# Patient Record
Sex: Female | Born: 1990 | Race: White | Hispanic: No | Marital: Single | Smoking: Former smoker
Health system: Southern US, Community
[De-identification: ages and names within clinical notes are randomized; demographics above are authoritative.]

## PROBLEM LIST (undated history)

## (undated) DIAGNOSIS — K259 Gastric ulcer, unspecified as acute or chronic, without hemorrhage or perforation: Secondary | ICD-10-CM

## (undated) DIAGNOSIS — A630 Anogenital (venereal) warts: Secondary | ICD-10-CM

---
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
## 2012-09-14 ENCOUNTER — Emergency Department (HOSPITAL_BASED_OUTPATIENT_CLINIC_OR_DEPARTMENT_OTHER)
Admission: EM | Admit: 2012-09-14 | Discharge: 2012-09-14 | Disposition: A | Discharge status: Home / Self Care | Payer: Self-pay | Attending: Emergency Medicine | Admitting: Emergency Medicine

## 2012-09-14 ENCOUNTER — Encounter (HOSPITAL_BASED_OUTPATIENT_CLINIC_OR_DEPARTMENT_OTHER): Payer: Self-pay | Admitting: *Deleted

## 2012-09-14 DIAGNOSIS — R05 Cough: Secondary | ICD-10-CM | POA: Insufficient documentation

## 2012-09-14 DIAGNOSIS — R630 Anorexia: Secondary | ICD-10-CM | POA: Insufficient documentation

## 2012-09-14 DIAGNOSIS — R42 Dizziness and giddiness: Secondary | ICD-10-CM | POA: Insufficient documentation

## 2012-09-14 DIAGNOSIS — J020 Streptococcal pharyngitis: Secondary | ICD-10-CM | POA: Insufficient documentation

## 2012-09-14 DIAGNOSIS — R509 Fever, unspecified: Secondary | ICD-10-CM | POA: Insufficient documentation

## 2012-09-14 DIAGNOSIS — R5381 Other malaise: Secondary | ICD-10-CM | POA: Insufficient documentation

## 2012-09-14 DIAGNOSIS — R51 Headache: Secondary | ICD-10-CM | POA: Insufficient documentation

## 2012-09-14 DIAGNOSIS — R55 Syncope and collapse: Secondary | ICD-10-CM | POA: Insufficient documentation

## 2012-09-14 DIAGNOSIS — R109 Unspecified abdominal pain: Secondary | ICD-10-CM | POA: Insufficient documentation

## 2012-09-14 DIAGNOSIS — L519 Erythema multiforme, unspecified: Secondary | ICD-10-CM | POA: Insufficient documentation

## 2012-09-14 DIAGNOSIS — F17200 Nicotine dependence, unspecified, uncomplicated: Secondary | ICD-10-CM | POA: Insufficient documentation

## 2012-09-14 DIAGNOSIS — R059 Cough, unspecified: Secondary | ICD-10-CM | POA: Insufficient documentation

## 2012-09-14 DIAGNOSIS — IMO0001 Reserved for inherently not codable concepts without codable children: Secondary | ICD-10-CM | POA: Insufficient documentation

## 2012-09-14 DIAGNOSIS — R000 Tachycardia, unspecified: Secondary | ICD-10-CM | POA: Insufficient documentation

## 2012-09-14 DIAGNOSIS — E860 Dehydration: Secondary | ICD-10-CM | POA: Insufficient documentation

## 2012-09-14 DIAGNOSIS — Z3202 Encounter for pregnancy test, result negative: Secondary | ICD-10-CM | POA: Insufficient documentation

## 2012-09-14 LAB — URINE MICROSCOPIC-ADD ON

## 2012-09-14 LAB — PREGNANCY, URINE: Preg Test, Ur: NEGATIVE

## 2012-09-14 LAB — URINALYSIS, ROUTINE W REFLEX MICROSCOPIC
Glucose, UA: NEGATIVE mg/dL
Hgb urine dipstick: NEGATIVE
Leukocytes, UA: NEGATIVE
pH: 6 (ref 5.0–8.0)

## 2012-09-14 LAB — RAPID STREP SCREEN (MED CTR MEBANE ONLY): Streptococcus, Group A Screen (Direct): POSITIVE — AB

## 2012-09-14 MED ORDER — SODIUM CHLORIDE 0.9 % IV SOLN
Freq: Once | INTRAVENOUS | Status: AC
  Administered 2012-09-14: 20:00:00 via INTRAVENOUS

## 2012-09-14 MED ORDER — MAGIC MOUTHWASH W/LIDOCAINE: 10.0000 mL | Freq: Four times a day (QID) | ORAL | Status: DC | PRN

## 2012-09-14 MED ORDER — SODIUM CHLORIDE 0.9 % IV SOLN: Freq: Once | INTRAVENOUS | Status: DC

## 2012-09-14 MED ORDER — SODIUM CHLORIDE 0.9 % IV BOLUS (SEPSIS)
1000.0000 mL | Freq: Once | INTRAVENOUS | Status: AC
  Administered 2012-09-14: 1000 mL via INTRAVENOUS

## 2012-09-14 MED ORDER — PENICILLIN G BENZATHINE 1200000 UNIT/2ML IM SUSP
1.2000 10*6.[IU] | Freq: Once | INTRAMUSCULAR | Status: AC
  Administered 2012-09-14: 1.2 10*6.[IU] via INTRAMUSCULAR
  Filled 2012-09-14: qty 2

## 2012-09-14 MED ORDER — HYDROCODONE-ACETAMINOPHEN 7.5-500 MG/15ML PO SOLN
10.0000 mL | Freq: Once | ORAL | Status: AC
  Administered 2012-09-14: 10 mL via ORAL
  Filled 2012-09-14: qty 15

## 2012-09-14 NOTE — ED Notes (Signed)
Sore throat, cough, fever, and headache. States she passed out this am while on the commode.

## 2012-09-14 NOTE — ED Provider Notes (Signed)
History     CSN: [id number]  Arrival date & time 09/14/12  1754   First MD Initiated Contact with Patient 09/14/12 1854      Chief Complaint  Patient presents with  . Sore Throat    (Consider location/radiation/quality/duration/timing/severity/associated sxs/prior treatment) HPI Comments: Barbara Smith is a 22 y.o. female presents with severe sore throat, nonproductive cough, subjective fever and chills with associated body aches and headache and reports she passed out while sitting on the toilet this afternoon after she had finished urinating.  She describes a sharp lower abdominal pain during urination which was fleeting and has since resolved, but speculates this pain made her faint. She has had decreased solid intake, but has tolerated lots of po fluids, so does not feel she is dehydrated. She has taken no medications for her fever, throat pain or headache.  The history is provided by the patient.    History reviewed. No pertinent past medical history.  History reviewed. No pertinent past surgical history.  No family history on file.  History  Substance Use Topics  . Smoking status: Current Every Day Smoker -- 0.5 packs/day    Types: Cigarettes  . Smokeless tobacco: Not on file  . Alcohol Use: Yes    OB History    Grav Para Term Preterm Abortions TAB SAB Ect Mult Living                  Review of Systems  Constitutional: Positive for fever and chills. Negative for appetite change.  HENT: Positive for sore throat. Negative for congestion, neck pain and neck stiffness.   Eyes: Negative.   Respiratory: Positive for cough. Negative for chest tightness and shortness of breath.   Cardiovascular: Negative for chest pain.  Gastrointestinal: Negative for nausea and abdominal pain.  Genitourinary: Negative.   Musculoskeletal: Negative for joint swelling and arthralgias.  Skin: Negative.  Negative for rash and wound.  Neurological: Positive for weakness, light-headedness and  headaches. Negative for dizziness and numbness.  Hematological: Negative.   Psychiatric/Behavioral: Negative.     Allergies  Review of patient's allergies indicates no known allergies.  Home Medications   Current Outpatient Rx  Name  Route  Sig  Dispense  Refill  . MAGIC MOUTHWASH W/LIDOCAINE   Oral   Take 10 mLs by mouth 4 (four) times daily as needed (pain).   100 mL   0     Equal parts     BP 114/49  Pulse 103  Temp 100 F (37.8 C) (Oral)  Resp 20  SpO2 98%  LMP 08/30/2012  Physical Exam  Nursing note and vitals reviewed. Constitutional: She appears well-developed and well-nourished.  HENT:  Head: Normocephalic and atraumatic. No trismus in the jaw.  Right Ear: Tympanic membrane normal.  Left Ear: Tympanic membrane normal.  Mouth/Throat: Mucous membranes are dry. Oropharyngeal exudate and posterior oropharyngeal erythema present. No posterior oropharyngeal edema or tonsillar abscesses.  Eyes: Conjunctivae normal are normal.  Neck: Normal range of motion and phonation normal.  Cardiovascular: Regular rhythm, normal heart sounds and intact distal pulses.  Tachycardia present.   Pulmonary/Chest: Effort normal and breath sounds normal. She has no wheezes. She has no rhonchi.  Abdominal: Soft. Bowel sounds are normal. There is no hepatosplenomegaly. There is no tenderness.  Musculoskeletal: Normal range of motion.  Neurological: She is alert.  Skin: Skin is warm and dry.  Psychiatric: She has a normal mood and affect.    ED Course  Procedures (including critical care  time)  Labs Reviewed  URINALYSIS, ROUTINE W REFLEX MICROSCOPIC - Abnormal; Notable for the following:    APPearance CLOUDY (*)     Ketones, ur 15 (*)     Protein, ur 30 (*)     All other components within normal limits  URINE MICROSCOPIC-ADD ON - Abnormal; Notable for the following:    Squamous Epithelial / LPF MANY (*)     Bacteria, UA FEW (*)     All other components within normal limits    RAPID STREP SCREEN - Abnormal; Notable for the following:    Streptococcus, Group A Screen (Direct) POSITIVE (*)     All other components within normal limits  PREGNANCY, URINE  URINE CULTURE   No results found.   1. Strep throat   2. Dehydration    Pt was extremely tachycardic and symptomatic with orthostatic maneuvers.  She was given NS 2 liters,  After which she tolerated po fluids and felt much improved and was able to ambulate in dept without sx.  She was given bicillin LA for strep tx.     MDM  Strep pharyngitis with dehydration.  No exam findings suggestive peritonsillar abscess,  No trismus.  Pt encouraged rest, increased fluids to maintain hydration. Prescribed lortab elixer for pain control.           Burgess Amor, PA 09/15/12 1150

## 2012-09-15 LAB — URINE CULTURE: Culture: NO GROWTH

## 2012-09-15 NOTE — ED Provider Notes (Signed)
Medical screening examination/treatment/procedure(s) were performed by non-physician practitioner and as supervising physician I was immediately available for consultation/collaboration.  Hurman Horn, MD 09/15/12 [phone]

## 2013-03-18 ENCOUNTER — Encounter (HOSPITAL_BASED_OUTPATIENT_CLINIC_OR_DEPARTMENT_OTHER): Payer: Self-pay | Admitting: *Deleted

## 2013-03-18 ENCOUNTER — Emergency Department (HOSPITAL_BASED_OUTPATIENT_CLINIC_OR_DEPARTMENT_OTHER)
Admission: EM | Admit: 2013-03-18 | Discharge: 2013-03-19 | Disposition: A | Discharge status: Home / Self Care | Payer: Self-pay | Attending: Emergency Medicine | Admitting: Emergency Medicine

## 2013-03-18 DIAGNOSIS — F17200 Nicotine dependence, unspecified, uncomplicated: Secondary | ICD-10-CM | POA: Insufficient documentation

## 2013-03-18 DIAGNOSIS — J039 Acute tonsillitis, unspecified: Secondary | ICD-10-CM | POA: Insufficient documentation

## 2013-03-18 DIAGNOSIS — R1310 Dysphagia, unspecified: Secondary | ICD-10-CM | POA: Insufficient documentation

## 2013-03-18 LAB — RAPID STREP SCREEN (MED CTR MEBANE ONLY): Streptococcus, Group A Screen (Direct): NEGATIVE

## 2013-03-18 NOTE — ED Notes (Signed)
Pt c/o sore throat x 1 day

## 2013-03-19 MED ORDER — PREDNISONE 10 MG PO TABS: 20.0000 mg | ORAL_TABLET | Freq: Two times a day (BID) | ORAL | Status: DC

## 2013-03-19 MED ORDER — CEPHALEXIN 500 MG PO CAPS: 500.0000 mg | ORAL_CAPSULE | Freq: Four times a day (QID) | ORAL | Status: DC

## 2013-03-19 NOTE — ED Provider Notes (Signed)
History    CSN: [id number] Arrival date & time 03/18/13  2226  First MD Initiated Contact with Patient 03/19/13 0004     Chief Complaint  Patient presents with  . Sore Throat   (Consider location/radiation/quality/duration/timing/severity/associated sxs/prior Treatment) HPI Comments: Patient with history of severe case of strep throat in the past.  Presents today with complaints of left tonsil, swelling, pain for the past day.  She has felt fevered but no chills.  Has not taken temperature.  Denies ill contacts.    Patient is a 22 y.o. female presenting with pharyngitis. The history is provided by the patient.  Sore Throat This is a new problem. The current episode started yesterday. The problem occurs constantly. The problem has been rapidly worsening. Pertinent negatives include no chest pain and no abdominal pain. The symptoms are aggravated by swallowing, drinking and eating. Nothing relieves the symptoms. She has tried nothing for the symptoms. The treatment provided no relief.   History reviewed. No pertinent past medical history. History reviewed. No pertinent past surgical history. History reviewed. No pertinent family history. History  Substance Use Topics  . Smoking status: Current Every Day Smoker -- 0.50 packs/day    Types: Cigarettes  . Smokeless tobacco: Not on file  . Alcohol Use: Yes   OB History   Grav Para Term Preterm Abortions TAB SAB Ect Mult Living                 Review of Systems  Cardiovascular: Negative for chest pain.  Gastrointestinal: Negative for abdominal pain.  All other systems reviewed and are negative.    Allergies  Review of patient's allergies indicates no known allergies.  Home Medications   Current Outpatient Rx  Name  Route  Sig  Dispense  Refill  . Alum & Mag Hydroxide-Simeth (MAGIC MOUTHWASH W/LIDOCAINE) SOLN   Oral   Take 10 mLs by mouth 4 (four) times daily as needed (pain).   100 mL   0     Equal parts   .  cephALEXin (KEFLEX) 500 MG capsule   Oral   Take 1 capsule (500 mg total) by mouth 4 (four) times daily.   28 capsule   0   . predniSONE (DELTASONE) 10 MG tablet   Oral   Take 2 tablets (20 mg total) by mouth 2 (two) times daily.   12 tablet   0    BP 129/59  Pulse 79  Temp(Src) 98.4 F (36.9 C) (Oral)  Resp 16  Ht 5\' 7"  (1.702 m)  Wt 202 lb (91.627 kg)  BMI 31.63 kg/m2  SpO2 100%  LMP 02/20/2013 Physical Exam  Nursing note and vitals reviewed. Constitutional: She is oriented to person, place, and time. She appears well-developed and well-nourished. No distress.  HENT:  Head: Normocephalic and atraumatic.  The left tonsil is mildly swollen and erythematous without exudates.  Neck: Normal range of motion. Neck supple.  Cardiovascular: Normal rate and regular rhythm.  Exam reveals no gallop and no friction rub.   No murmur heard. Pulmonary/Chest: Effort normal and breath sounds normal. No respiratory distress. She has no wheezes.  No stridor.  Abdominal: Soft. Bowel sounds are normal. She exhibits no distension. There is no tenderness.  Musculoskeletal: Normal range of motion.  Neurological: She is alert and oriented to person, place, and time.  Skin: Skin is warm and dry. She is not diaphoretic.    ED Course  Procedures (including critical care time) Labs Reviewed  RAPID STREP SCREEN  CULTURE, GROUP A STREP   No results found. 1. Tonsillitis     MDM  I see nothing that indicates peritonsillar abscess or impending airway compromise.  The tonsil is mildly swollen.  Will treat with keflex for tonsillitis.  Return prn if she worsens.   Geoffery Lyons, MD 03/19/13 [phone]

## 2013-03-21 LAB — CULTURE, GROUP A STREP

## 2013-05-04 ENCOUNTER — Encounter (HOSPITAL_BASED_OUTPATIENT_CLINIC_OR_DEPARTMENT_OTHER): Payer: Self-pay | Admitting: Emergency Medicine

## 2013-05-04 ENCOUNTER — Emergency Department (HOSPITAL_BASED_OUTPATIENT_CLINIC_OR_DEPARTMENT_OTHER)
Admission: EM | Admit: 2013-05-04 | Discharge: 2013-05-04 | Disposition: A | Discharge status: Home / Self Care | Payer: Self-pay | Attending: Emergency Medicine | Admitting: Emergency Medicine

## 2013-05-04 DIAGNOSIS — A499 Bacterial infection, unspecified: Secondary | ICD-10-CM | POA: Insufficient documentation

## 2013-05-04 DIAGNOSIS — N898 Other specified noninflammatory disorders of vagina: Secondary | ICD-10-CM | POA: Insufficient documentation

## 2013-05-04 DIAGNOSIS — F17200 Nicotine dependence, unspecified, uncomplicated: Secondary | ICD-10-CM | POA: Insufficient documentation

## 2013-05-04 DIAGNOSIS — N72 Inflammatory disease of cervix uteri: Secondary | ICD-10-CM | POA: Insufficient documentation

## 2013-05-04 DIAGNOSIS — N760 Acute vaginitis: Secondary | ICD-10-CM | POA: Insufficient documentation

## 2013-05-04 DIAGNOSIS — IMO0002 Reserved for concepts with insufficient information to code with codable children: Secondary | ICD-10-CM | POA: Insufficient documentation

## 2013-05-04 DIAGNOSIS — Z79899 Other long term (current) drug therapy: Secondary | ICD-10-CM | POA: Insufficient documentation

## 2013-05-04 DIAGNOSIS — Z3202 Encounter for pregnancy test, result negative: Secondary | ICD-10-CM | POA: Insufficient documentation

## 2013-05-04 DIAGNOSIS — B9689 Other specified bacterial agents as the cause of diseases classified elsewhere: Secondary | ICD-10-CM | POA: Insufficient documentation

## 2013-05-04 LAB — URINALYSIS, ROUTINE W REFLEX MICROSCOPIC
Bilirubin Urine: NEGATIVE
Glucose, UA: NEGATIVE mg/dL
Hgb urine dipstick: NEGATIVE
Ketones, ur: NEGATIVE mg/dL
Leukocytes, UA: NEGATIVE
pH: 6 (ref 5.0–8.0)

## 2013-05-04 LAB — WET PREP, GENITAL

## 2013-05-04 MED ORDER — METRONIDAZOLE 500 MG PO TABS: 500.0000 mg | ORAL_TABLET | Freq: Two times a day (BID) | ORAL | Status: DC

## 2013-05-04 MED ORDER — AZITHROMYCIN 1 G PO PACK
1.0000 g | PACK | Freq: Once | ORAL | Status: AC
  Administered 2013-05-04: 1 g via ORAL
  Filled 2013-05-04: qty 1

## 2013-05-04 MED ORDER — CEFTRIAXONE SODIUM 250 MG IJ SOLR
250.0000 mg | Freq: Once | INTRAMUSCULAR | Status: AC
  Administered 2013-05-04: 250 mg via INTRAMUSCULAR
  Filled 2013-05-04: qty 250

## 2013-05-04 NOTE — ED Notes (Signed)
Pt c/o burning with urination and vaginal discharge.

## 2013-05-04 NOTE — ED Provider Notes (Signed)
CSN: [id number]     Arrival date & time 05/04/13  2031 History   First MD Initiated Contact with Patient 05/04/13 2139     Chief Complaint  Patient presents with  . Dysuria  . Vaginal Discharge   (Consider location/radiation/quality/duration/timing/severity/associated sxs/prior Treatment) Patient is a 22 y.o. female presenting with vaginal discharge. The history is provided by the patient.  Vaginal Discharge Quality:  Clear Severity:  Moderate Duration:  3 weeks Timing:  Constant Progression:  Unchanged Chronicity:  New Associated symptoms: dysuria   Associated symptoms: no abdominal pain, no fever, no nausea and no vomiting   Risk factors: new sexual partner     History reviewed. No pertinent past medical history. History reviewed. No pertinent past surgical history. No family history on file. History  Substance Use Topics  . Smoking status: Current Every Day Smoker -- 0.50 packs/day    Types: Cigarettes  . Smokeless tobacco: Not on file  . Alcohol Use: Yes   OB History   Grav Para Term Preterm Abortions TAB SAB Ect Mult Living                 Review of Systems  Constitutional: Negative for fever and chills.  Gastrointestinal: Negative for nausea, vomiting and abdominal pain.  Genitourinary: Positive for dysuria and vaginal discharge. Negative for frequency, flank pain, decreased urine volume, vaginal bleeding and menstrual problem.  Musculoskeletal: Negative for back pain.  All other systems reviewed and are negative.    Allergies  Review of patient's allergies indicates no known allergies.  Home Medications   Current Outpatient Rx  Name  Route  Sig  Dispense  Refill  . Alum & Mag Hydroxide-Simeth (MAGIC MOUTHWASH W/LIDOCAINE) SOLN   Oral   Take 10 mLs by mouth 4 (four) times daily as needed (pain).   100 mL   0     Equal parts   . cephALEXin (KEFLEX) 500 MG capsule   Oral   Take 1 capsule (500 mg total) by mouth 4 (four) times daily.   28 capsule  0   . predniSONE (DELTASONE) 10 MG tablet   Oral   Take 2 tablets (20 mg total) by mouth 2 (two) times daily.   12 tablet   0    BP 116/67  Pulse 75  Temp(Src) 98.3 F (36.8 C) (Oral)  Resp 18  Ht 5\' 8"  (1.727 m)  Wt 214 lb (97.07 kg)  BMI 32.55 kg/m2  SpO2 100%  LMP 04/20/2013 Physical Exam  Nursing note and vitals reviewed. Constitutional: She is oriented to person, place, and time. She appears well-developed and well-nourished.  HENT:  Head: Normocephalic and atraumatic.  Right Ear: External ear normal.  Left Ear: External ear normal.  Nose: Nose normal.  Eyes: Right eye exhibits no discharge. Left eye exhibits no discharge.  Cardiovascular: Normal rate, regular rhythm and normal heart sounds.   Pulmonary/Chest: Effort normal and breath sounds normal.  Abdominal: Soft. There is no tenderness.  Genitourinary: Uterus normal. Uterus is not tender. Cervix exhibits no motion tenderness. Right adnexum displays no mass and no tenderness. Left adnexum displays no mass. Vaginal discharge found.  Neurological: She is alert and oriented to person, place, and time.  Skin: Skin is warm and dry.    ED Course  Procedures (including critical care time) Labs Review Labs Reviewed  WET PREP, GENITAL - Abnormal; Notable for the following:    Clue Cells Wet Prep HPF POC MODERATE (*)    WBC, Wet Prep HPF  POC MANY (*)    All other components within normal limits  GC/CHLAMYDIA PROBE AMP  URINALYSIS, ROUTINE W REFLEX MICROSCOPIC  PREGNANCY, URINE   Imaging Review No results found.  MDM   1. Cervicitis   2. BV (bacterial vaginosis)    22 year old female with 3 weeks of vaginal discharge. This is similar to the time she had chlamydia. She is also had some burning at the end of urination. No vaginal bleeding. She is not pregnant. Her exam is benign except for some moderate discharge in her vagina. Due to her high risk for STD we will treat with Rocephin IM and Cipro by mouth. She is  not have exam consistent with PID. Given her clue cells Will also treat for BV.    Audree Camel, MD 05/04/13 [phone]

## 2013-05-07 NOTE — ED Notes (Signed)
+  Chlamydia Patient treated with Chlamydia-treated with rocephin and Zithromax letter faxed.

## 2013-07-25 ENCOUNTER — Emergency Department (HOSPITAL_BASED_OUTPATIENT_CLINIC_OR_DEPARTMENT_OTHER)
Admission: EM | Admit: 2013-07-25 | Discharge: 2013-07-25 | Disposition: A | Discharge status: Home / Self Care | Payer: Self-pay | Attending: Emergency Medicine | Admitting: Emergency Medicine

## 2013-07-25 ENCOUNTER — Encounter (HOSPITAL_BASED_OUTPATIENT_CLINIC_OR_DEPARTMENT_OTHER): Payer: Self-pay | Admitting: Emergency Medicine

## 2013-07-25 DIAGNOSIS — O26899 Other specified pregnancy related conditions, unspecified trimester: Secondary | ICD-10-CM

## 2013-07-25 DIAGNOSIS — O9989 Other specified diseases and conditions complicating pregnancy, childbirth and the puerperium: Secondary | ICD-10-CM | POA: Insufficient documentation

## 2013-07-25 DIAGNOSIS — O99330 Smoking (tobacco) complicating pregnancy, unspecified trimester: Secondary | ICD-10-CM | POA: Insufficient documentation

## 2013-07-25 DIAGNOSIS — R109 Unspecified abdominal pain: Secondary | ICD-10-CM | POA: Insufficient documentation

## 2013-07-25 LAB — PREGNANCY, URINE: Preg Test, Ur: POSITIVE — AB

## 2013-07-25 LAB — URINALYSIS, ROUTINE W REFLEX MICROSCOPIC
Nitrite: NEGATIVE
Specific Gravity, Urine: 1.025 (ref 1.005–1.030)
Urobilinogen, UA: 1 mg/dL (ref 0.0–1.0)

## 2013-07-25 LAB — HCG, QUANTITATIVE, PREGNANCY: hCG, Beta Chain, Quant, S: 3521 m[IU]/mL — ABNORMAL HIGH (ref <5)

## 2013-07-25 NOTE — ED Provider Notes (Signed)
CSN: [id number]     Arrival date & time 07/25/13  1409 History  This chart was scribed for Gerhard Munch, MD by Leone Payor, ED Scribe. This patient was seen in room MH02/MH02 and the patient's care was started 3:32 PM.    Chief Complaint  Patient presents with  . Abdominal Pain    The history is provided by the patient. No language interpreter was used.     HPI Comments: Barbara Smith is a 22 y.o. female who presents to the Emergency Department complaining of constant, waxing and waning lower abdominal that began this morning. She states the pain is tolerable at this time but was more severe earlier today. Pt states she found out she was pregnant 2 days ago and has not had a chance to be seen by her OB-GYN. Pt states this is her second pregnancy. Her LNMP was October 5th. She has not taken any OTC medications for these symptoms. She reports having some nausea which she believes is consistent with her morning sickness symptoms. She denies fever, vomiting, diarrhea. She reports that she quit smoking 2 months ago. She does not take any daily medications.   OB-GYN in [place]  History reviewed. No pertinent past medical history. History reviewed. No pertinent past surgical history. History reviewed. No pertinent family history. History  Substance Use Topics  . Smoking status: Current Every Day Smoker -- 0.50 packs/day    Types: Cigarettes  . Smokeless tobacco: Not on file  . Alcohol Use: Yes   OB History   Grav Para Term Preterm Abortions TAB SAB Ect Mult Living                 Review of Systems  Constitutional: Negative for fever.       Per HPI, otherwise negative  HENT:       Per HPI, otherwise negative  Respiratory:       Per HPI, otherwise negative  Cardiovascular: Negative for leg swelling.       Per HPI, otherwise negative  Gastrointestinal: Positive for nausea and abdominal pain. Negative for vomiting and diarrhea.  Endocrine:       Negative aside from HPI   Genitourinary: Negative for dysuria, frequency, hematuria, vaginal bleeding and vaginal discharge.       Neg aside from HPI   Musculoskeletal:       Per HPI, otherwise negative  Skin: Negative.  Negative for rash.  Neurological: Negative for syncope.  Psychiatric/Behavioral: Negative for confusion.    Allergies  Review of patient's allergies indicates no known allergies.  Home Medications  No current outpatient prescriptions on file. BP 120/72  Pulse 82  Temp(Src) 98.1 F (36.7 C)  Resp 16  Wt 209 lb (94.802 kg)  SpO2 99%  LMP 06/13/2013 Physical Exam  Nursing note and vitals reviewed. Constitutional: She is oriented to person, place, and time. She appears well-developed and well-nourished. No distress.  HENT:  Head: Normocephalic and atraumatic.  Eyes: Conjunctivae and EOM are normal.  Cardiovascular: Normal rate, regular rhythm and normal heart sounds.   Pulmonary/Chest: Effort normal and breath sounds normal. No stridor. No respiratory distress. She has no wheezes. She has no rales.  Abdominal: Soft. She exhibits no distension. There is no tenderness.  Musculoskeletal: She exhibits no edema.  Neurological: She is alert and oriented to person, place, and time. No cranial nerve deficit.  Skin: Skin is warm and dry.  Psychiatric: She has a normal mood and affect.    ED Course  Procedures   DIAGNOSTIC STUDIES: Oxygen Saturation is 99% on RA, normal by my interpretation.    COORDINATION OF CARE: 3:47 PM Will order UA, urine pregnancy test, and HCG pregnancy test. Discussed treatment plan with pt at bedside and pt agreed to plan.    5:20 PM Discussed lab results with patient. Will perform [place] at this time.   Labs Review Labs Reviewed  PREGNANCY, URINE - Abnormal; Notable for the following:    Preg Test, Ur POSITIVE (*)    All other components within normal limits  HCG, QUANTITATIVE, PREGNANCY - Abnormal; Notable for the following:    hCG, Beta Chain, Quant, S 3521  (*)    All other components within normal limits  URINALYSIS, ROUTINE W REFLEX MICROSCOPIC   Imaging Review No results found.  EKG Interpretation   None      Procedure note limited ultrasound Indication abdominal pain in pregnancy Consent verbal Cardiac probe used for multiple views of the lower abdomen.  No free fluid visualized, no masses visualized, no clear heart beat visualized. No tenderness to palpation throughout the exam.  Images saved Procedure well tolerated.  MDM   1. Abdominal pain in pregnancy, antepartum     I personally performed the services described in this documentation, which was scribed in my presence. The recorded information has been reviewed and is accurate.  This patient presents early in pregnancy with waxing/waning abdominal discomfort, primarily laterally.  Notably, on arrival the patient has no abdominal pain, and throughout her emergency department course developed pain.  Patient is stable, no evidence of distress, no hypotension.  Ultrasound did not demonstrate free fluid in the abdomen, but there is no clearly visualized IUP either.  With the patient's low quantitative hCG, the absence of distress, her endorsement of having an obstetrician, she is appropriate for next a followup for a repeat labs, consideration of repeat formal ultrasound.  She was discharged in stable condition after discussion on explicit return precautions, follow instructions.   Gerhard Munch, MD 07/25/13 [phone]

## 2013-07-25 NOTE — ED Notes (Signed)
Pt having lower right sided abdominal pain since this am.  No vaginal discharge.  Pt states she is pregnant, last period oct 5th.  No known fever.  No emesis.

## 2013-10-11 ENCOUNTER — Emergency Department (HOSPITAL_BASED_OUTPATIENT_CLINIC_OR_DEPARTMENT_OTHER)
Admission: EM | Admit: 2013-10-11 | Discharge: 2013-10-11 | Disposition: A | Discharge status: Home / Self Care | Payer: Medicaid Other | Attending: Emergency Medicine | Admitting: Emergency Medicine

## 2013-10-11 ENCOUNTER — Encounter (HOSPITAL_BASED_OUTPATIENT_CLINIC_OR_DEPARTMENT_OTHER): Payer: Self-pay | Admitting: Emergency Medicine

## 2013-10-11 DIAGNOSIS — J020 Streptococcal pharyngitis: Secondary | ICD-10-CM | POA: Insufficient documentation

## 2013-10-11 DIAGNOSIS — F17200 Nicotine dependence, unspecified, uncomplicated: Secondary | ICD-10-CM | POA: Insufficient documentation

## 2013-10-11 LAB — RAPID STREP SCREEN (MED CTR MEBANE ONLY): STREPTOCOCCUS, GROUP A SCREEN (DIRECT): POSITIVE — AB

## 2013-10-11 MED ORDER — PENICILLIN V POTASSIUM 250 MG PO TABS: 250.0000 mg | ORAL_TABLET | Freq: Four times a day (QID) | ORAL | Status: AC

## 2013-10-11 NOTE — ED Notes (Signed)
Pt reports that she has strep throat.  Hx of same multiple times.  Denies fever, denies body aches.

## 2013-10-11 NOTE — ED Provider Notes (Deleted)
CSN: [id number]     Arrival date & time 10/11/13  1221 History   First MD Initiated Contact with Patient 10/11/13 1238     Chief Complaint  Patient presents with  . Sore Throat    HPI Pt reports that she has strep throat. Hx of same multiple times. Denies fever, denies body aches.  History reviewed. No pertinent past medical history. History reviewed. No pertinent past surgical history. History reviewed. No pertinent family history. History  Substance Use Topics  . Smoking status: Current Every Day Smoker -- 0.50 packs/day    Types: Cigarettes  . Smokeless tobacco: Not on file  . Alcohol Use: No   OB History   Grav Para Term Preterm Abortions TAB SAB Ect Mult Living                 Review of Systems  All other systems reviewed and are negative.    Allergies  Review of patient's allergies indicates no known allergies.  Home Medications   Current Outpatient Rx  Name  Route  Sig  Dispense  Refill  . penicillin v potassium (VEETID) 250 MG tablet   Oral   Take 1 tablet (250 mg total) by mouth 4 (four) times daily.   40 tablet   0    BP 108/53  Pulse 82  Temp(Src) 98.6 F (37 C) (Oral)  Resp 16  Ht 5\' 7"  (1.702 m)  Wt 215 lb (97.523 kg)  BMI 33.67 kg/m2  LMP 07/14/2013 Physical Exam  Nursing note and vitals reviewed. Constitutional: She is oriented to person, place, and time. She appears well-developed and well-nourished. No distress.  HENT:  Head: Normocephalic and atraumatic.  Eyes: Pupils are equal, round, and reactive to light.  Neck: Normal range of motion.  Cardiovascular: Normal rate and intact distal pulses.   Pulmonary/Chest: No respiratory distress.  Abdominal: Normal appearance. She exhibits no distension.  Musculoskeletal: Normal range of motion.  Lymphadenopathy:    She has cervical adenopathy.       Right cervical: Superficial cervical adenopathy present. No deep cervical and no posterior cervical adenopathy present. Neurological: She is alert  and oriented to person, place, and time. No cranial nerve deficit.  Skin: Skin is warm and dry. No rash noted.  Psychiatric: She has a normal mood and affect. Her behavior is normal.    ED Course  Procedures (including critical care time) Labs Review Labs Reviewed  RAPID STREP SCREEN - Abnormal; Notable for the following:    Streptococcus, Group A Screen (Direct) POSITIVE (*)    All other components within normal limits   Imaging Review No results found.    MDM   1. Strep pharyngitis        Nelia Shiobert L Amala Petion, MD 10/11/13 [phone]

## 2013-10-11 NOTE — ED Notes (Signed)
Pt reports that she thinks that she is [redacted] weeks pregnant.  Has had no prenatal care and does not take any prenatal vitamins.  Pt reports lower abdominal pain without vaginal discharge.

## 2013-10-11 NOTE — Discharge Instructions (Signed)
Sore Throat  A sore throat is a painful, burning, sore, or scratchy feeling of the throat. There may be pain or tenderness when swallowing or talking. You may have other symptoms with a sore throat. These include coughing, sneezing, fever, or a swollen neck. A sore throat is often the first sign of another sickness. These sicknesses may include a cold, flu, strep throat, or an infection called mono. Most sore throats go away without medical treatment.   HOME CARE   · Only take medicine as told by your doctor.  · Drink enough fluids to keep your pee (urine) clear or pale yellow.  · Rest as needed.  · Try using throat sprays, lozenges, or suck on hard candy (if older than 4 years or as told).  · Sip warm liquids, such as broth, herbal tea, or warm water with honey. Try sucking on frozen ice pops or drinking cold liquids.  · Rinse the mouth (gargle) with salt water. Mix 1 teaspoon salt with 8 ounces of water.  · Do not smoke. Avoid being around others when they are smoking.  · Put a humidifier in your bedroom at night to moisten the air. You can also turn on a hot shower and sit in the bathroom for 5 10 minutes. Be sure the bathroom door is closed.  GET HELP RIGHT AWAY IF:   · You have trouble breathing.  · You cannot swallow fluids, soft foods, or your spit (saliva).  · You have more puffiness (swelling) in the throat.  · Your sore throat does not get better in 7 days.  · You feel sick to your stomach (nauseous) and throw up (vomit).  · You have a fever or lasting symptoms for more than 2 3 days.  · You have a fever and your symptoms suddenly get worse.  MAKE SURE YOU:   · Understand these instructions.  · Will watch your condition.  · Will get help right away if you are not doing well or get worse.  Document Released: 06/04/2008 Document Revised: 05/20/2012 Document Reviewed: 05/03/2012  [employer]® Patient Information ©2014 [employer].

## 2013-10-11 NOTE — ED Provider Notes (Addendum)
CSN: [id number]     Arrival date & time 10/11/13  1221 History   First MD Initiated Contact with Patient 10/11/13 1238     Chief Complaint  Patient presents with  . Sore Throat    HPI Pt reports that she has strep throat. Hx of same multiple times. Denies fever, denies body aches.  History reviewed. No pertinent past medical history. History reviewed. No pertinent past surgical history. History reviewed. No pertinent family history. History  Substance Use Topics  . Smoking status: Current Every Day Smoker -- 0.50 packs/day    Types: Cigarettes  . Smokeless tobacco: Not on file  . Alcohol Use: No   OB History   Grav Para Term Preterm Abortions TAB SAB Ect Mult Living                 Review of Systems All other systems reviewed and are negative Allergies  Review of patient's allergies indicates no known allergies.  Home Medications   Current Outpatient Rx  Name  Route  Sig  Dispense  Refill  . penicillin v potassium (VEETID) 250 MG tablet   Oral   Take 1 tablet (250 mg total) by mouth 4 (four) times daily.   40 tablet   0    BP 108/53  Pulse 82  Temp(Src) 98.6 F (37 C) (Oral)  Resp 16  Ht 5\' 7"  (1.702 m)  Wt 215 lb (97.523 kg)  BMI 33.67 kg/m2  LMP 07/14/2013 Physical Exam  Nursing note and vitals reviewed. Constitutional: She is oriented to person, place, and time. She appears well-developed and well-nourished. No distress.  HENT:  Head: Normocephalic and atraumatic.  Mouth/Throat: No oropharyngeal exudate.  Eyes: Pupils are equal, round, and reactive to light.  Neck: Normal range of motion.  Cardiovascular: Normal rate and intact distal pulses.   Pulmonary/Chest: No respiratory distress.  Abdominal: Normal appearance. She exhibits no distension.  Musculoskeletal: Normal range of motion.  Lymphadenopathy:    She has cervical adenopathy.       Right cervical: Superficial cervical adenopathy present. No deep cervical and no posterior cervical adenopathy  present. Neurological: She is alert and oriented to person, place, and time. No cranial nerve deficit.  Skin: Skin is warm and dry. No rash noted.  Psychiatric: She has a normal mood and affect. Her behavior is normal.    ED Course  Procedures (including critical care time) Labs Review Labs Reviewed  RAPID STREP SCREEN - Abnormal; Notable for the following:    Streptococcus, Group A Screen (Direct) POSITIVE (*)    All other components within normal limits   Imaging Review No results found.    MDM   1. Strep pharyngitis        Nelia Shiobert L Vina Byrd, MD 10/11/13 1332  Nelia Shiobert L Keoshia Steinmetz, MD 10/11/13 [phone]

## 2014-04-27 ENCOUNTER — Encounter (HOSPITAL_BASED_OUTPATIENT_CLINIC_OR_DEPARTMENT_OTHER): Payer: Self-pay | Admitting: Emergency Medicine

## 2014-04-27 ENCOUNTER — Emergency Department (HOSPITAL_BASED_OUTPATIENT_CLINIC_OR_DEPARTMENT_OTHER)
Admission: EM | Admit: 2014-04-27 | Discharge: 2014-04-27 | Disposition: A | Discharge status: Home / Self Care | Payer: Medicaid Other | Attending: Emergency Medicine | Admitting: Emergency Medicine

## 2014-04-27 DIAGNOSIS — O909 Complication of the puerperium, unspecified: Secondary | ICD-10-CM

## 2014-04-27 DIAGNOSIS — A6000 Herpesviral infection of urogenital system, unspecified: Secondary | ICD-10-CM | POA: Diagnosis not present

## 2014-04-27 DIAGNOSIS — N390 Urinary tract infection, site not specified: Secondary | ICD-10-CM | POA: Insufficient documentation

## 2014-04-27 DIAGNOSIS — O99335 Smoking (tobacco) complicating the puerperium: Secondary | ICD-10-CM | POA: Diagnosis not present

## 2014-04-27 DIAGNOSIS — Z3202 Encounter for pregnancy test, result negative: Secondary | ICD-10-CM | POA: Diagnosis not present

## 2014-04-27 DIAGNOSIS — A499 Bacterial infection, unspecified: Secondary | ICD-10-CM | POA: Diagnosis not present

## 2014-04-27 DIAGNOSIS — O9853 Other viral diseases complicating the puerperium: Secondary | ICD-10-CM | POA: Insufficient documentation

## 2014-04-27 DIAGNOSIS — O2390 Unspecified genitourinary tract infection in pregnancy, unspecified trimester: Secondary | ICD-10-CM | POA: Insufficient documentation

## 2014-04-27 DIAGNOSIS — N760 Acute vaginitis: Secondary | ICD-10-CM | POA: Diagnosis not present

## 2014-04-27 DIAGNOSIS — O26899 Other specified pregnancy related conditions, unspecified trimester: Secondary | ICD-10-CM | POA: Diagnosis present

## 2014-04-27 DIAGNOSIS — B9689 Other specified bacterial agents as the cause of diseases classified elsewhere: Secondary | ICD-10-CM | POA: Insufficient documentation

## 2014-04-27 DIAGNOSIS — A6002 Herpesviral infection of other male genital organs: Secondary | ICD-10-CM

## 2014-04-27 LAB — URINE MICROSCOPIC-ADD ON

## 2014-04-27 LAB — URINALYSIS, ROUTINE W REFLEX MICROSCOPIC
Bilirubin Urine: NEGATIVE
Glucose, UA: NEGATIVE mg/dL
Hgb urine dipstick: NEGATIVE
KETONES UR: NEGATIVE mg/dL
NITRITE: NEGATIVE
PH: 7.5 (ref 5.0–8.0)
Protein, ur: 30 mg/dL — AB
Specific Gravity, Urine: 1.026 (ref 1.005–1.030)
Urobilinogen, UA: 1 mg/dL (ref 0.0–1.0)

## 2014-04-27 LAB — WET PREP, GENITAL
TRICH WET PREP: NONE SEEN
Yeast Wet Prep HPF POC: NONE SEEN

## 2014-04-27 LAB — PREGNANCY, URINE: Preg Test, Ur: NEGATIVE

## 2014-04-27 MED ORDER — VALACYCLOVIR HCL 1 G PO TABS: 1000.0000 mg | ORAL_TABLET | Freq: Two times a day (BID) | ORAL | Status: AC

## 2014-04-27 MED ORDER — CEFTRIAXONE SODIUM 250 MG IJ SOLR
250.0000 mg | Freq: Once | INTRAMUSCULAR | Status: AC
  Administered 2014-04-27: 250 mg via INTRAMUSCULAR
  Filled 2014-04-27: qty 250

## 2014-04-27 MED ORDER — AZITHROMYCIN 250 MG PO TABS
1000.0000 mg | ORAL_TABLET | Freq: Once | ORAL | Status: AC
  Administered 2014-04-27: 1000 mg via ORAL
  Filled 2014-04-27: qty 4

## 2014-04-27 MED ORDER — CEPHALEXIN 500 MG PO CAPS: 500.0000 mg | ORAL_CAPSULE | Freq: Two times a day (BID) | ORAL | Status: DC

## 2014-04-27 MED ORDER — METRONIDAZOLE 500 MG PO TABS: 500.0000 mg | ORAL_TABLET | Freq: Two times a day (BID) | ORAL | Status: DC

## 2014-04-27 MED ORDER — LIDOCAINE HCL (PF) 1 % IJ SOLN
INTRAMUSCULAR | Status: AC
  Administered 2014-04-27: 1.2 mL
  Filled 2014-04-27: qty 5

## 2014-04-27 NOTE — ED Provider Notes (Signed)
CSN: [id number]     Arrival date & time 04/27/14  1928 History   First MD Initiated Contact with Patient 04/27/14 2039     Chief Complaint  Patient presents with  . Dysuria     (Consider location/radiation/quality/duration/timing/severity/associated sxs/prior Treatment) HPI Comments: Pt states that she had a vaginal delivery on July 19th. Pt states that she is having a yellow and white discharge. Also having burning with urination. Denies fever or abdominal pain  The history is provided by the patient. No language interpreter was used.    History reviewed. No pertinent past medical history. History reviewed. No pertinent past surgical history. No family history on file. History  Substance Use Topics  . Smoking status: Current Every Day Smoker -- 0.50 packs/day    Types: Cigarettes  . Smokeless tobacco: Not on file  . Alcohol Use: Yes     Comment: occ   OB History   Grav Para Term Preterm Abortions TAB SAB Ect Mult Living                 Review of Systems  Constitutional: Negative.   Respiratory: Negative.   Cardiovascular: Negative.       Allergies  Review of patient's allergies indicates no known allergies.  Home Medications   Prior to Admission medications   Not on File   BP 115/74  Pulse 95  Temp(Src) 98.1 F (36.7 C) (Oral)  Resp 16  Ht 5\' 7"  (1.702 m)  Wt 223 lb (101.152 kg)  BMI 34.92 kg/m2  SpO2 97% Physical Exam  Nursing note and vitals reviewed. Constitutional: She is oriented to person, place, and time. She appears well-developed and well-nourished.  Cardiovascular: Normal rate and regular rhythm.   Pulmonary/Chest: Effort normal and breath sounds normal.  Abdominal: Soft. Bowel sounds are normal. There is no tenderness.  Genitourinary:  Yellow discharge. No cmt  Musculoskeletal: Normal range of motion.  Neurological: She is alert and oriented to person, place, and time.  Skin:  Indurated sores with yellow drainage to labia  Psychiatric:  She has a normal mood and affect.    ED Course  Procedures (including critical care time) Labs Review Labs Reviewed  WET PREP, GENITAL - Abnormal; Notable for the following:    Clue Cells Wet Prep HPF POC FEW (*)    WBC, Wet Prep HPF POC MANY (*)    All other components within normal limits  URINALYSIS, ROUTINE W REFLEX MICROSCOPIC - Abnormal; Notable for the following:    Protein, ur 30 (*)    Leukocytes, UA MODERATE (*)    All other components within normal limits  URINE MICROSCOPIC-ADD ON - Abnormal; Notable for the following:    Squamous Epithelial / LPF FEW (*)    Bacteria, UA FEW (*)    All other components within normal limits  GC/CHLAMYDIA PROBE AMP  PREGNANCY, URINE    Imaging Review No results found.   EKG Interpretation None      MDM   Final diagnoses:  BV (bacterial vaginosis)  Herpes genitalis in men  UTI (lower urinary tract infection)    No onset herpes. Discussed std transmission and if pts child develops illness she needs to tell the doctor.will treat for uti and bv as well    Teressa LowerVrinda Loris Winrow, NP 04/27/14 2212

## 2014-04-27 NOTE — ED Provider Notes (Signed)
Medical screening examination/treatment/procedure(s) were performed by non-physician practitioner and as supervising physician I was immediately available for consultation/collaboration.  Toy CookeyMegan Tabbatha Bordelon, MD 04/27/14 [phone]

## 2014-04-27 NOTE — Discharge Instructions (Signed)
Your partner or partners need to be treated. Follow up with your doctor for any change in symptoms Bacterial Vaginosis Bacterial vaginosis is a vaginal infection that occurs when the normal balance of bacteria in the vagina is disrupted. It results from an overgrowth of certain bacteria. This is the most common vaginal infection in women of childbearing age. Treatment is important to prevent complications, especially in pregnant women, as it can cause a premature delivery. CAUSES  Bacterial vaginosis is caused by an increase in harmful bacteria that are normally present in smaller amounts in the vagina. Several different kinds of bacteria can cause bacterial vaginosis. However, the reason that the condition develops is not fully understood. RISK FACTORS Certain activities or behaviors can put you at an increased risk of developing bacterial vaginosis, including:  Having a new sex partner or multiple sex partners.  Douching.  Using an intrauterine device (IUD) for contraception. Women do not get bacterial vaginosis from toilet seats, bedding, swimming pools, or contact with objects around them. SIGNS AND SYMPTOMS  Some women with bacterial vaginosis have no signs or symptoms. Common symptoms include:  Grey vaginal discharge.  A fishlike odor with discharge, especially after sexual intercourse.  Itching or burning of the vagina and vulva.  Burning or pain with urination. DIAGNOSIS  Your health care provider will take a medical history and examine the vagina for signs of bacterial vaginosis. A sample of vaginal fluid may be taken. Your health care provider will look at this sample under a microscope to check for bacteria and abnormal cells. A vaginal pH test may also be done.  TREATMENT  Bacterial vaginosis may be treated with antibiotic medicines. These may be given in the form of a pill or a vaginal cream. A second round of antibiotics may be prescribed if the condition comes back after  treatment.  HOME CARE INSTRUCTIONS   Only take over-the-counter or prescription medicines as directed by your health care provider.  If antibiotic medicine was prescribed, take it as directed. Make sure you finish it even if you start to feel better.  Do not have sex until treatment is completed.  Tell all sexual partners that you have a vaginal infection. They should see their health care provider and be treated if they have problems, such as a mild rash or itching.  Practice safe sex by using condoms and only having one sex partner. SEEK MEDICAL CARE IF:   Your symptoms are not improving after 3 days of treatment.  You have increased discharge or pain.  You have a fever. MAKE SURE YOU:   Understand these instructions.  Will watch your condition.  Will get help right away if you are not doing well or get worse. FOR MORE INFORMATION  Centers for Disease Control and Prevention, Division of STD Prevention: SolutionApps.co.zawww.cdc.gov/std American Sexual Health Association (ASHA): www.ashastd.org  Document Released: 08/26/2005 Document Revised: 06/16/2013 Document Reviewed: 04/07/2013 [facility] Patient Information 2015 [place], [place]. This information is not intended to replace advice given to you by your health care provider. Make sure you discuss any questions you have with your health care provider.

## 2014-04-27 NOTE — ED Notes (Signed)
Vaginal delivery on July 19th.  Two days ago noticed yellow and white vaginal discharge and red bumps with itching inside labia.  Also extreme burning with urination.

## 2014-04-28 LAB — GC/CHLAMYDIA PROBE AMP
CT PROBE, AMP APTIMA: NEGATIVE
GC PROBE AMP APTIMA: NEGATIVE

## 2015-04-14 ENCOUNTER — Emergency Department (HOSPITAL_BASED_OUTPATIENT_CLINIC_OR_DEPARTMENT_OTHER)
Admission: EM | Admit: 2015-04-14 | Discharge: 2015-04-14 | Discharge status: Against Medical Advice | Payer: Medicaid Other | Attending: Emergency Medicine | Admitting: Emergency Medicine

## 2015-04-14 ENCOUNTER — Encounter (HOSPITAL_BASED_OUTPATIENT_CLINIC_OR_DEPARTMENT_OTHER): Payer: Self-pay | Admitting: Emergency Medicine

## 2015-04-14 DIAGNOSIS — R6884 Jaw pain: Secondary | ICD-10-CM | POA: Diagnosis not present

## 2015-04-14 DIAGNOSIS — K088 Other specified disorders of teeth and supporting structures: Secondary | ICD-10-CM | POA: Diagnosis not present

## 2015-04-14 DIAGNOSIS — Z720 Tobacco use: Secondary | ICD-10-CM | POA: Insufficient documentation

## 2015-04-14 NOTE — ED Notes (Signed)
Pt report right jaw pain onset x1 day very painful

## 2015-04-14 NOTE — ED Notes (Signed)
Call to waiting area w/o reply x2 pt assume to have departed the building

## 2018-03-27 ENCOUNTER — Other Ambulatory Visit: Payer: Self-pay

## 2018-03-27 ENCOUNTER — Encounter (HOSPITAL_BASED_OUTPATIENT_CLINIC_OR_DEPARTMENT_OTHER): Payer: Self-pay

## 2018-03-27 ENCOUNTER — Emergency Department (HOSPITAL_BASED_OUTPATIENT_CLINIC_OR_DEPARTMENT_OTHER)
Admission: EM | Admit: 2018-03-27 | Discharge: 2018-03-27 | Disposition: A | Discharge status: Home / Self Care | Payer: Medicaid Other | Attending: Emergency Medicine | Admitting: Emergency Medicine

## 2018-03-27 DIAGNOSIS — J029 Acute pharyngitis, unspecified: Secondary | ICD-10-CM | POA: Diagnosis present

## 2018-03-27 DIAGNOSIS — J020 Streptococcal pharyngitis: Secondary | ICD-10-CM | POA: Diagnosis not present

## 2018-03-27 LAB — RAPID STREP SCREEN (MED CTR MEBANE ONLY): Streptococcus, Group A Screen (Direct): POSITIVE — AB

## 2018-03-27 MED ORDER — KETOROLAC TROMETHAMINE 60 MG/2ML IM SOLN
30.0000 mg | Freq: Once | INTRAMUSCULAR | Status: AC
  Administered 2018-03-27: 30 mg via INTRAMUSCULAR
  Filled 2018-03-27: qty 2

## 2018-03-27 MED ORDER — PENICILLIN G BENZATHINE 1200000 UNIT/2ML IM SUSP
1.2000 10*6.[IU] | Freq: Once | INTRAMUSCULAR | Status: AC
  Administered 2018-03-27: 1.2 10*6.[IU] via INTRAMUSCULAR
  Filled 2018-03-27: qty 2

## 2018-03-27 NOTE — ED Provider Notes (Signed)
[facility] EMERGENCY DEPARTMENT Provider Note   CSN: [id number] Arrival date & time: 03/27/18  2132     History   Chief Complaint Chief Complaint  Patient presents with  . Sore Throat    HPI Barbara Smith is a 27 y.o. female.  Patient presents with complaint of sore throat, subjective fever, occasional cough, headache over the past 2 days.  She has been taking Mucinex with relief of symptoms.  No nausea, vomiting, or diarrhea.  No chest pain or shortness of breath.  She reports positive sick contacts with sore throat recently. The onset of this condition was acute. The course is constant. Aggravating factors: swallowing.      History reviewed. No pertinent past medical history.  There are no active problems to display for this patient.   History reviewed. No pertinent surgical history.   OB History   None      Home Medications    Prior to Admission medications   Medication Sig Start Date End Date Taking? Authorizing Provider  cephALEXin (KEFLEX) 500 MG capsule Take 1 capsule (500 mg total) by mouth 2 (two) times daily. 04/27/14   Teressa Lower, NP  metroNIDAZOLE (FLAGYL) 500 MG tablet Take 1 tablet (500 mg total) by mouth 2 (two) times daily. 04/27/14   Teressa Lower, NP    Family History No family history on file.  Social History Social History   Tobacco Use  . Smoking status: Current Every Day Smoker    Packs/day: 0.50    Types: Cigarettes  . Smokeless tobacco: Never Used  Substance Use Topics  . Alcohol use: Yes    Comment: occ  . Drug use: No     Allergies   Patient has no known allergies.   Review of Systems Review of Systems  Constitutional: Positive for chills and fever. Negative for fatigue.  HENT: Positive for sore throat and trouble swallowing. Negative for congestion, ear pain, rhinorrhea and sinus pressure.   Eyes: Negative for redness.  Respiratory: Positive for cough. Negative for wheezing.   Gastrointestinal:  Negative for abdominal pain, diarrhea, nausea and vomiting.  Genitourinary: Negative for dysuria.  Musculoskeletal: Negative for myalgias and neck stiffness.  Skin: Negative for rash.  Neurological: Positive for headaches.  Hematological: Negative for adenopathy.     Physical Exam Updated Vital Signs BP (!) 141/105 (BP Location: Left Arm)   Pulse 88   Temp 98.2 F (36.8 C) (Oral)   Resp 20   Ht 5\' 7"  (1.702 m)   Wt 135 kg (297 lb 9.9 oz)   SpO2 100%   BMI 46.61 kg/m   Physical Exam  Constitutional: She appears well-developed and well-nourished.  HENT:  Head: Normocephalic and atraumatic.  Right Ear: Tympanic membrane, external ear and ear canal normal.  Left Ear: Tympanic membrane, external ear and ear canal normal.  Nose: Nose normal. No mucosal edema or rhinorrhea.  Mouth/Throat: Uvula is midline and mucous membranes are normal. Mucous membranes are not dry. No oral lesions. No trismus in the jaw. No uvula swelling. Oropharyngeal exudate, posterior oropharyngeal edema and posterior oropharyngeal erythema present. No tonsillar abscesses.  Eyes: Conjunctivae are normal. Right eye exhibits no discharge. Left eye exhibits no discharge.  Neck: Normal range of motion. Neck supple.  Cardiovascular: Normal rate, regular rhythm and normal heart sounds.  Pulmonary/Chest: Effort normal and breath sounds normal. No respiratory distress. She has no wheezes. She has no rales.  Abdominal: Soft. There is no tenderness.  Lymphadenopathy:    She  has no cervical adenopathy.  Neurological: She is alert.  Skin: Skin is warm and dry.  Psychiatric: She has a normal mood and affect.  Nursing note and vitals reviewed.    ED Treatments / Results  Labs (all labs ordered are listed, but only abnormal results are displayed) Labs Reviewed  RAPID STREP SCREEN ([facility]) - Abnormal; Notable for the following components:      Result Value   Streptococcus, Group A Screen (Direct) POSITIVE (*)     All other components within normal limits    EKG None  Radiology No results found.  Procedures Procedures (including critical care time)  Medications Ordered in ED Medications  penicillin g benzathine (BICILLIN LA) 1200000 UNIT/2ML injection 1.2 Million Units (1.2 Million Units Intramuscular Given 03/27/18 2253)  ketorolac (TORADOL) injection 30 mg (30 mg Intramuscular Given 03/27/18 2253)     Initial Impression / Assessment and Plan / ED Course  I have reviewed the triage vital signs and the nursing notes.  Pertinent labs & imaging results that were available during my care of the patient were reviewed by me and considered in my medical decision making (see chart for details).     Patient seen and examined.  Strep test positive.  IM Bicillin given for infection, IM Toradol given for pain.  Vital signs reviewed and are as follows: BP (!) 141/105 (BP Location: Left Arm)   Pulse 88   Temp 98.2 F (36.8 C) (Oral)   Resp 20   Ht 5\' 7"  (1.702 m)   Wt 135 kg (297 lb 9.9 oz)   SpO2 100%   BMI 46.61 kg/m   Patient counseled on supportive symptoms for home including use of Tylenol and ibuprofen.  Encouraged return with worsening symptoms, inability to swallow, hyper fever, new symptoms or other concerns.  Patient verbalizes understanding and agrees with plan.   Final Clinical Impressions(s) / ED Diagnoses   Final diagnoses:  Strep pharyngitis   Patient with history and exam consistent with streptococcal pharyngitis.  Treatment as above.  No peritonsillar abscess or airway compromise noted at time of exam.  Patient appears well, nontoxic.   ED Discharge Orders    None       Renne CriglerGeiple, Hadli Vandemark, PA-C 03/27/18 2350    Melene PlanFloyd, Dan, DO 03/29/18 1500

## 2018-03-27 NOTE — ED Triage Notes (Signed)
C/o sore throat x 2 days-NAD-steady gait 

## 2018-03-27 NOTE — Discharge Instructions (Signed)
Please read and follow all provided instructions.  Your diagnoses today include:  1. Strep pharyngitis    Tests performed today include:  Strep test: was POSITIVE for strep throat  Vital signs. See below for your results today.   Medications prescribed:  You were given a one-time shot of penicillin to treat your strep throat.   Take any medications prescribed only as directed.   Home care instructions:  Please read the educational materials provided and follow any instructions contained in this packet.  Follow-up instructions: Please follow-up with your primary care provider as needed for further evaluation of your symptoms.  Return instructions:   Please return to the Emergency Department if you experience worsening symptoms.   Return if you have worsening problems swallowing, your neck becomes swollen, you cannot swallow your saliva or your voice becomes muffled.   Return with high persistent fever, persistent vomiting, or if you have trouble breathing.   Please return if you have any other emergent concerns.  Additional Information:  Your vital signs today were: BP (!) 141/105 (BP Location: Left Arm)    Pulse 88    Temp 98.2 F (36.8 C) (Oral)    Resp 20    Ht 5\' 7"  (1.702 m)    Wt 135 kg (297 lb 9.9 oz)    SpO2 100%    BMI 46.61 kg/m  If your blood pressure (BP) was elevated above 135/85 this visit, please have this repeated by your doctor within one month. --------------

## 2018-07-27 ENCOUNTER — Encounter (HOSPITAL_BASED_OUTPATIENT_CLINIC_OR_DEPARTMENT_OTHER): Payer: Self-pay | Admitting: *Deleted

## 2018-07-27 ENCOUNTER — Emergency Department (HOSPITAL_BASED_OUTPATIENT_CLINIC_OR_DEPARTMENT_OTHER): Payer: Medicaid Other

## 2018-07-27 ENCOUNTER — Emergency Department (HOSPITAL_BASED_OUTPATIENT_CLINIC_OR_DEPARTMENT_OTHER)
Admission: EM | Admit: 2018-07-27 | Discharge: 2018-07-27 | Disposition: A | Discharge status: Home / Self Care | Payer: Medicaid Other | Attending: Emergency Medicine | Admitting: Emergency Medicine

## 2018-07-27 ENCOUNTER — Other Ambulatory Visit: Payer: Self-pay

## 2018-07-27 DIAGNOSIS — M545 Low back pain, unspecified: Secondary | ICD-10-CM

## 2018-07-27 DIAGNOSIS — F17210 Nicotine dependence, cigarettes, uncomplicated: Secondary | ICD-10-CM | POA: Diagnosis not present

## 2018-07-27 DIAGNOSIS — R1031 Right lower quadrant pain: Secondary | ICD-10-CM | POA: Insufficient documentation

## 2018-07-27 LAB — COMPREHENSIVE METABOLIC PANEL
ALT: 29 U/L (ref 0–44)
ANION GAP: 9 (ref 5–15)
AST: 20 U/L (ref 15–41)
Albumin: 3.6 g/dL (ref 3.5–5.0)
Alkaline Phosphatase: 76 U/L (ref 38–126)
BILIRUBIN TOTAL: 0.3 mg/dL (ref 0.3–1.2)
BUN: 15 mg/dL (ref 6–20)
CO2: 24 mmol/L (ref 22–32)
Calcium: 8.6 mg/dL — ABNORMAL LOW (ref 8.9–10.3)
Chloride: 107 mmol/L (ref 98–111)
Creatinine, Ser: 0.69 mg/dL (ref 0.44–1.00)
Glucose, Bld: 109 mg/dL — ABNORMAL HIGH (ref 70–99)
POTASSIUM: 3.8 mmol/L (ref 3.5–5.1)
Sodium: 140 mmol/L (ref 135–145)
TOTAL PROTEIN: 6.4 g/dL — AB (ref 6.5–8.1)

## 2018-07-27 LAB — URINALYSIS, ROUTINE W REFLEX MICROSCOPIC
BILIRUBIN URINE: NEGATIVE
GLUCOSE, UA: NEGATIVE mg/dL
HGB URINE DIPSTICK: NEGATIVE
KETONES UR: NEGATIVE mg/dL
Leukocytes, UA: NEGATIVE
Nitrite: NEGATIVE
PROTEIN: NEGATIVE mg/dL
Specific Gravity, Urine: 1.025 (ref 1.005–1.030)
pH: 6.5 (ref 5.0–8.0)

## 2018-07-27 LAB — CBC WITH DIFFERENTIAL/PLATELET
Abs Immature Granulocytes: 0.03 10*3/uL (ref 0.00–0.07)
BASOS PCT: 0 %
Basophils Absolute: 0 10*3/uL (ref 0.0–0.1)
Eosinophils Absolute: 0.1 10*3/uL (ref 0.0–0.5)
Eosinophils Relative: 1 %
HCT: 38.7 % (ref 36.0–46.0)
Hemoglobin: 12.1 g/dL (ref 12.0–15.0)
IMMATURE GRANULOCYTES: 0 %
Lymphocytes Relative: 27 %
Lymphs Abs: 3.1 10*3/uL (ref 0.7–4.0)
MCH: 28.1 pg (ref 26.0–34.0)
MCHC: 31.3 g/dL (ref 30.0–36.0)
MCV: 90 fL (ref 80.0–100.0)
MONOS PCT: 7 %
Monocytes Absolute: 0.8 10*3/uL (ref 0.1–1.0)
NEUTROS PCT: 65 %
NRBC: 0 % (ref 0.0–0.2)
Neutro Abs: 7.3 10*3/uL (ref 1.7–7.7)
PLATELETS: 323 10*3/uL (ref 150–400)
RBC: 4.3 MIL/uL (ref 3.87–5.11)
RDW: 12.8 % (ref 11.5–15.5)
WBC: 11.3 10*3/uL — AB (ref 4.0–10.5)

## 2018-07-27 LAB — LIPASE, BLOOD: LIPASE: 24 U/L (ref 11–51)

## 2018-07-27 LAB — PREGNANCY, URINE: Preg Test, Ur: NEGATIVE

## 2018-07-27 MED ORDER — IOPAMIDOL (ISOVUE-300) INJECTION 61%
100.0000 mL | Freq: Once | INTRAVENOUS | Status: AC | PRN
  Administered 2018-07-27: 100 mL via INTRAVENOUS

## 2018-07-27 MED ORDER — ONDANSETRON 4 MG PO TBDP: 4.0000 mg | ORAL_TABLET | Freq: Three times a day (TID) | ORAL | 0 refills | Status: DC | PRN

## 2018-07-27 NOTE — Discharge Instructions (Addendum)

## 2018-07-27 NOTE — ED Provider Notes (Signed)
[facility] EMERGENCY DEPARTMENT Provider Note   CSN: [id number] Arrival date & time: 07/27/18  1805     History   Chief Complaint Chief Complaint  Patient presents with  . Abdominal Pain  . Back Pain    HPI Barbara Smith is a 27 y.o. female who presents today for evaluation of right-sided abdominal and back pain for 3 days.  She says that the pain started on the right side, is primarily in her right lower quadrant of her abdomen.  She denies any dysuria, hematuria, or diarrhea.  She denies any abnormal vaginal discharge.  She reports that she has recently been evaluated by her OB/GYN, does not have any GYN related concerns.  She says that the pain radiates around to her right sided back and occasionally to the left side.  No fevers.  She has vomited 4 times since the pain started.    HPI  History reviewed. No pertinent past medical history.  There are no active problems to display for this patient.   History reviewed. No pertinent surgical history.   OB History   None      Home Medications    Prior to Admission medications   Medication Sig Start Date End Date Taking? Authorizing Provider  cephALEXin (KEFLEX) 500 MG capsule Take 1 capsule (500 mg total) by mouth 2 (two) times daily. 04/27/14   Glendell Docker, NP  metroNIDAZOLE (FLAGYL) 500 MG tablet Take 1 tablet (500 mg total) by mouth 2 (two) times daily. 04/27/14   Glendell Docker, NP  ondansetron (ZOFRAN ODT) 4 MG disintegrating tablet Take 1 tablet (4 mg total) by mouth every 8 (eight) hours as needed for nausea or vomiting. 07/27/18   Lorin Glass, PA-C    Family History History reviewed. No pertinent family history.  Social History Social History   Tobacco Use  . Smoking status: Current Every Day Smoker    Packs/day: 0.50    Types: Cigarettes  . Smokeless tobacco: Never Used  Substance Use Topics  . Alcohol use: Yes    Comment: occ  . Drug use: No     Allergies   Patient has  no known allergies.   Review of Systems Review of Systems  Constitutional: Negative for chills and fever.  HENT: Negative for ear pain and sore throat.   Eyes: Negative for pain and visual disturbance.  Respiratory: Negative for cough and shortness of breath.   Cardiovascular: Negative for chest pain and palpitations.  Gastrointestinal: Positive for abdominal pain, nausea and vomiting. Negative for constipation and diarrhea.  Genitourinary: Negative for dysuria, frequency, hematuria, urgency, vaginal bleeding, vaginal discharge and vaginal pain.  Musculoskeletal: Negative for arthralgias and back pain.  Skin: Negative for color change and rash.  Neurological: Negative for seizures and syncope.  All other systems reviewed and are negative.    Physical Exam Updated Vital Signs BP 135/79 (BP Location: Left Arm)   Pulse 80   Temp 98.3 F (36.8 C) (Oral)   Resp 18   Ht '5\' 7"'  (1.702 m)   Wt 129.3 kg   SpO2 96%   BMI 44.64 kg/m   Physical Exam  Constitutional: She is oriented to person, place, and time. She appears well-developed and well-nourished.  Non-toxic appearance. No distress.  HENT:  Head: Normocephalic and atraumatic.  Eyes: Conjunctivae are normal.  Neck: Neck supple.  Cardiovascular: Normal rate and regular rhythm.  No murmur heard. Pulmonary/Chest: Effort normal and breath sounds normal. No respiratory distress.  Abdominal: Soft.  Normal appearance and bowel sounds are normal. There is tenderness in the right lower quadrant. There is no rigidity, no rebound, no guarding and no CVA tenderness.  Genitourinary:  Genitourinary Comments: Patient refused.   Musculoskeletal: She exhibits no edema.  Generalized right sided lumbar back pain.  No crepitus or deformities.  No midline TTP over lower back.   Neurological: She is alert and oriented to person, place, and time.  Skin: Skin is warm and dry.  Psychiatric: She has a normal mood and affect. Her behavior is normal.    Nursing note and vitals reviewed.    ED Treatments / Results  Labs (all labs ordered are listed, but only abnormal results are displayed) Labs Reviewed  COMPREHENSIVE METABOLIC PANEL - Abnormal; Notable for the following components:      Result Value   Glucose, Bld 109 (*)    Calcium 8.6 (*)    Total Protein 6.4 (*)    All other components within normal limits  CBC WITH DIFFERENTIAL/PLATELET - Abnormal; Notable for the following components:   WBC 11.3 (*)    All other components within normal limits  URINALYSIS, ROUTINE W REFLEX MICROSCOPIC  PREGNANCY, URINE  LIPASE, BLOOD    EKG None  Radiology Ct Abdomen Pelvis W Contrast  Result Date: 07/27/2018 CLINICAL DATA:  Back and abdominal pain x3 days. Concern for appendicitis. EXAM: CT ABDOMEN AND PELVIS WITH CONTRAST TECHNIQUE: Multidetector CT imaging of the abdomen and pelvis was performed using the standard protocol following bolus administration of intravenous contrast. CONTRAST:  12m ISOVUE-300 IOPAMIDOL (ISOVUE-300) INJECTION 61% COMPARISON:  None. FINDINGS: Lower chest: No acute abnormality. Hepatobiliary: No focal liver abnormality is seen. No gallstones, gallbladder wall thickening, or biliary dilatation. Pancreas: Unremarkable. No pancreatic ductal dilatation or surrounding inflammatory changes. Spleen: Normal in size without focal abnormality. Adrenals/Urinary Tract: Adrenal glands are unremarkable. Kidneys are normal, without renal calculi, focal lesion, or hydronephrosis. Bladder is unremarkable. Stomach/Bowel: Stomach is within normal limits. Appendix appears normal. No evidence of bowel wall thickening, distention, or inflammatory changes. Vascular/Lymphatic: No significant vascular findings are present. No enlarged abdominal or pelvic lymph nodes. Reproductive: Uterus and bilateral adnexa are unremarkable. Other: Tiny periumbilical fat containing hernia. No abdominopelvic ascites. Musculoskeletal: No acute or significant  osseous findings. IMPRESSION: No acute abdominal or pelvic pathology.  Normal appearing appendix. Electronically Signed   By: DAshley RoyaltyM.D.   On: 07/27/2018 22:43    Procedures Procedures (including critical care time)  Medications Ordered in ED Medications  iopamidol (ISOVUE-300) 61 % injection 100 mL (100 mLs Intravenous Contrast Given 07/27/18 2223)     Initial Impression / Assessment and Plan / ED Course  I have reviewed the triage vital signs and the nursing notes.  Pertinent labs & imaging results that were available during my care of the patient were reviewed by me and considered in my medical decision making (see chart for details).  Clinical Course as of Jul 27 2337  Mon Jul 27, 2018  2252 Friday, vomited 4 times,    [EH]  2256 Zofran No Diarrhea.    [EH]    Clinical Course User Index [EH] HLorin Glass PA-C   Patient presents today for evaluation of 3 days of right lower quadrant abdominal pain radiating to her back and occasionally to the left side and lower abdomen.  She has nausea and vomiting.  No dysuria, urinary symptoms, or abnormal vaginal discharge.  She refused pelvic exam saying that she has recently had a pelvic exam.  She had tenderness to palpation in the right lower quadrant of the abdomen.  No CVA tenderness to percussion.  Given that she still has her appendix, with slightly elevated white count of 11.3 concern for appendicitis.  Her urine is without evidence of infection.  Her lipase is not elevated.  AST, ALT, alk phos are all normal.  CT scan was performed of abdomen pelvis which did not show any acute abnormalities.  She refused pelvic exam, however her pregnancy test was negative.  Recommended following up as an outpatient for pelvic exam as she refused this today.  Suspect musculoskeletal pain with possible viral GI illness.  PCP follow-up. She is given rx for zofran.    Return precautions were discussed with patient who states their  understanding.  At the time of discharge patient denied any unaddressed complaints or concerns.  Patient is agreeable for discharge home.   Final Clinical Impressions(s) / ED Diagnoses   Final diagnoses:  RLQ abdominal pain  Acute bilateral low back pain without sciatica    ED Discharge Orders         Ordered    ondansetron (ZOFRAN ODT) 4 MG disintegrating tablet  Every 8 hours PRN     07/27/18 2317           Lorin Glass, PA-C 07/27/18 2341    Lennice Sites, DO 07/28/18 0124

## 2018-07-27 NOTE — ED Notes (Signed)
ED Provider at bedside. 

## 2018-07-27 NOTE — ED Triage Notes (Signed)
Back and abdominal pain x 3 days.

## 2018-08-28 ENCOUNTER — Emergency Department (HOSPITAL_BASED_OUTPATIENT_CLINIC_OR_DEPARTMENT_OTHER)
Admission: EM | Admit: 2018-08-28 | Discharge: 2018-08-28 | Discharge status: Against Medical Advice | Payer: Medicaid Other | Attending: Emergency Medicine | Admitting: Emergency Medicine

## 2018-08-28 ENCOUNTER — Encounter (HOSPITAL_BASED_OUTPATIENT_CLINIC_OR_DEPARTMENT_OTHER): Payer: Self-pay

## 2018-08-28 ENCOUNTER — Other Ambulatory Visit: Payer: Self-pay

## 2018-08-28 DIAGNOSIS — R112 Nausea with vomiting, unspecified: Secondary | ICD-10-CM | POA: Insufficient documentation

## 2018-08-28 DIAGNOSIS — Z5321 Procedure and treatment not carried out due to patient leaving prior to being seen by health care provider: Secondary | ICD-10-CM | POA: Diagnosis not present

## 2018-08-28 DIAGNOSIS — R109 Unspecified abdominal pain: Secondary | ICD-10-CM | POA: Diagnosis present

## 2018-08-28 HISTORY — DX: Gastric ulcer, unspecified as acute or chronic, without hemorrhage or perforation: K25.9

## 2018-08-28 NOTE — ED Notes (Signed)
Pt is not in ED WR 

## 2018-08-28 NOTE — ED Triage Notes (Signed)
C/o abd pain,nausea x 4 days-NAD-steady gait

## 2018-08-28 NOTE — ED Notes (Signed)
Pt seen walking outside w 2 other people  Did not notify staff if she was leaving

## 2019-06-22 ENCOUNTER — Encounter (HOSPITAL_BASED_OUTPATIENT_CLINIC_OR_DEPARTMENT_OTHER): Payer: Self-pay | Admitting: Emergency Medicine

## 2019-06-22 ENCOUNTER — Emergency Department (HOSPITAL_BASED_OUTPATIENT_CLINIC_OR_DEPARTMENT_OTHER)
Admission: EM | Admit: 2019-06-22 | Discharge: 2019-06-22 | Discharge status: Against Medical Advice | Payer: Medicaid Other | Attending: Emergency Medicine | Admitting: Emergency Medicine

## 2019-06-22 ENCOUNTER — Other Ambulatory Visit: Payer: Self-pay

## 2019-06-22 ENCOUNTER — Emergency Department (HOSPITAL_BASED_OUTPATIENT_CLINIC_OR_DEPARTMENT_OTHER): Payer: Medicaid Other

## 2019-06-22 DIAGNOSIS — Z5320 Procedure and treatment not carried out because of patient's decision for unspecified reasons: Secondary | ICD-10-CM | POA: Diagnosis not present

## 2019-06-22 DIAGNOSIS — F17210 Nicotine dependence, cigarettes, uncomplicated: Secondary | ICD-10-CM | POA: Diagnosis not present

## 2019-06-22 DIAGNOSIS — R102 Pelvic and perineal pain: Secondary | ICD-10-CM

## 2019-06-22 LAB — URINALYSIS, ROUTINE W REFLEX MICROSCOPIC
Bilirubin Urine: NEGATIVE
Glucose, UA: NEGATIVE mg/dL
Hgb urine dipstick: NEGATIVE
Ketones, ur: NEGATIVE mg/dL
Leukocytes,Ua: NEGATIVE
Nitrite: NEGATIVE
Protein, ur: NEGATIVE mg/dL
Specific Gravity, Urine: 1.02 (ref 1.005–1.030)
pH: 7 (ref 5.0–8.0)

## 2019-06-22 LAB — COMPREHENSIVE METABOLIC PANEL
ALT: 24 U/L (ref 0–44)
AST: 18 U/L (ref 15–41)
Albumin: 3.6 g/dL (ref 3.5–5.0)
Alkaline Phosphatase: 71 U/L (ref 38–126)
Anion gap: 10 (ref 5–15)
BUN: 11 mg/dL (ref 6–20)
CO2: 23 mmol/L (ref 22–32)
Calcium: 8.8 mg/dL — ABNORMAL LOW (ref 8.9–10.3)
Chloride: 106 mmol/L (ref 98–111)
Creatinine, Ser: 0.55 mg/dL (ref 0.44–1.00)
GFR calc Af Amer: >60 mL/min (ref >60)
GFR calc non Af Amer: >60 mL/min (ref >60)
Glucose, Bld: 94 mg/dL (ref 70–99)
Potassium: 4.1 mmol/L (ref 3.5–5.1)
Sodium: 139 mmol/L (ref 135–145)
Total Bilirubin: 0.5 mg/dL (ref 0.3–1.2)
Total Protein: 6.5 g/dL (ref 6.5–8.1)

## 2019-06-22 LAB — WET PREP, GENITAL
Sperm: NONE SEEN
Trich, Wet Prep: NONE SEEN
Yeast Wet Prep HPF POC: NONE SEEN

## 2019-06-22 LAB — CBC WITH DIFFERENTIAL/PLATELET
Abs Immature Granulocytes: 0.04 10*3/uL (ref 0.00–0.07)
Basophils Absolute: 0 10*3/uL (ref 0.0–0.1)
Basophils Relative: 0 %
Eosinophils Absolute: 0.1 10*3/uL (ref 0.0–0.5)
Eosinophils Relative: 1 %
HCT: 40 % (ref 36.0–46.0)
Hemoglobin: 12.5 g/dL (ref 12.0–15.0)
Immature Granulocytes: 0 %
Lymphocytes Relative: 27 %
Lymphs Abs: 2.6 10*3/uL (ref 0.7–4.0)
MCH: 27.7 pg (ref 26.0–34.0)
MCHC: 31.3 g/dL (ref 30.0–36.0)
MCV: 88.5 fL (ref 80.0–100.0)
Monocytes Absolute: 0.9 10*3/uL (ref 0.1–1.0)
Monocytes Relative: 9 %
Neutro Abs: 6.2 10*3/uL (ref 1.7–7.7)
Neutrophils Relative %: 63 %
Platelets: 329 10*3/uL (ref 150–400)
RBC: 4.52 MIL/uL (ref 3.87–5.11)
RDW: 13.3 % (ref 11.5–15.5)
WBC: 9.8 10*3/uL (ref 4.0–10.5)
nRBC: 0 % (ref 0.0–0.2)

## 2019-06-22 LAB — PREGNANCY, URINE: Preg Test, Ur: NEGATIVE

## 2019-06-22 MED ORDER — ONDANSETRON 4 MG PO TBDP: 4.0000 mg | ORAL_TABLET | Freq: Three times a day (TID) | ORAL | 0 refills | Status: DC | PRN

## 2019-06-22 MED ORDER — ONDANSETRON HCL 4 MG/2ML IJ SOLN
4.0000 mg | Freq: Once | INTRAMUSCULAR | Status: AC
  Administered 2019-06-22: 12:00:00 4 mg via INTRAVENOUS
  Filled 2019-06-22: qty 2

## 2019-06-22 MED ORDER — SODIUM CHLORIDE 0.9 % IV BOLUS
1000.0000 mL | Freq: Once | INTRAVENOUS | Status: AC
  Administered 2019-06-22: 1000 mL via INTRAVENOUS

## 2019-06-22 NOTE — ED Provider Notes (Signed)
[facility] EMERGENCY DEPARTMENT Provider Note   CSN: [id number] Arrival date & time: 06/22/19  1004     History   Chief Complaint Chief Complaint  Patient presents with  . Abdominal Pain  . Emesis    HPI Gerome ApleyHana R Brodhead is a 28 y.o. female.     Patient with no past surgical history presents the emergency department with 2 days of intermittent stabbing lower abdominal pain on the left and right sides.  She denies any urinary symptoms including hematuria, dysuria, increased frequency urgency.  She denies any vaginal bleeding or discharge but she reports being sexually active.  She uses Nexplanon for birth control.  She has had episodes of vomiting, approximately every 2 hours overnight.  No chest pain or shortness of breath.  No fevers.  Reports some chills.  No treatments prior to arrival.     Past Medical History:  Diagnosis Date  . Gastric ulcer     There are no active problems to display for this patient.   History reviewed. No pertinent surgical history.   OB History   No obstetric history on file.      Home Medications    Prior to Admission medications   Medication Sig Start Date End Date Taking? Authorizing Provider  cephALEXin (KEFLEX) 500 MG capsule Take 1 capsule (500 mg total) by mouth 2 (two) times daily. 04/27/14   Teressa LowerPickering, Vrinda, NP  metroNIDAZOLE (FLAGYL) 500 MG tablet Take 1 tablet (500 mg total) by mouth 2 (two) times daily. 04/27/14   Teressa LowerPickering, Vrinda, NP  ondansetron (ZOFRAN ODT) 4 MG disintegrating tablet Take 1 tablet (4 mg total) by mouth every 8 (eight) hours as needed for nausea or vomiting. 07/27/18   Cristina GongHammond, Elizabeth W, PA-C    Family History History reviewed. No pertinent family history.  Social History Social History   Tobacco Use  . Smoking status: Current Every Day Smoker    Packs/day: 0.50    Types: Cigarettes  . Smokeless tobacco: Never Used  Substance Use Topics  . Alcohol use: Yes    Comment: occ  . Drug  use: No     Allergies   Patient has no known allergies.   Review of Systems Review of Systems  Constitutional: Positive for chills. Negative for fever.  HENT: Negative for rhinorrhea and sore throat.   Eyes: Negative for redness.  Respiratory: Negative for cough.   Cardiovascular: Negative for chest pain.  Gastrointestinal: Positive for abdominal pain, nausea and vomiting. Negative for constipation and diarrhea.  Genitourinary: Positive for pelvic pain. Negative for dysuria.  Musculoskeletal: Negative for myalgias.  Skin: Negative for rash.  Neurological: Negative for headaches.     Physical Exam Updated Vital Signs BP 134/88 (BP Location: Left Arm)   Pulse (!) 103   Temp 98.4 F (36.9 C) (Oral)   Resp 20   Ht 5\' 7"  (1.702 m)   Wt (!) 145.2 kg   SpO2 98%   BMI 50.12 kg/m   Physical Exam Vitals signs and nursing note reviewed. Exam conducted with a chaperone present.  Constitutional:      Appearance: She is well-developed.  HENT:     Head: Normocephalic and atraumatic.  Eyes:     General:        Right eye: No discharge.        Left eye: No discharge.     Conjunctiva/sclera: Conjunctivae normal.  Neck:     Musculoskeletal: Normal range of motion and neck supple.  Cardiovascular:  Rate and Rhythm: Normal rate and regular rhythm.     Heart sounds: Normal heart sounds.  Pulmonary:     Effort: Pulmonary effort is normal.     Breath sounds: Normal breath sounds.  Abdominal:     Palpations: Abdomen is soft.     Tenderness: There is abdominal tenderness in the right lower quadrant, suprapubic area and left lower quadrant. There is no guarding or rebound. Negative signs include Murphy's sign.  Genitourinary:    Exam position: Lithotomy position.     Labia:        Right: No tenderness.        Left: No tenderness.      Vagina: Vaginal discharge (white, mild) present.     Cervix: No cervical motion tenderness or discharge.     Uterus: Tender (mild).       Adnexa:        Right: No mass or tenderness.         Left: Tenderness present. No mass.    Skin:    General: Skin is warm and dry.  Neurological:     Mental Status: She is alert.      ED Treatments / Results  Labs (all labs ordered are listed, but only abnormal results are displayed) Labs Reviewed  WET PREP, GENITAL - Abnormal; Notable for the following components:      Result Value   Clue Cells Wet Prep HPF POC PRESENT (*)    WBC, Wet Prep HPF POC MODERATE (*)    All other components within normal limits  COMPREHENSIVE METABOLIC PANEL - Abnormal; Notable for the following components:   Calcium 8.8 (*)    All other components within normal limits  URINALYSIS, ROUTINE W REFLEX MICROSCOPIC  PREGNANCY, URINE  CBC WITH DIFFERENTIAL/PLATELET  GC/CHLAMYDIA PROBE AMP () NOT AT [facility]    EKG None  Radiology No results found.  Procedures Procedures (including critical care time)  Medications Ordered in ED Medications  ondansetron (ZOFRAN) injection 4 mg (4 mg Intravenous Given 06/22/19 1143)  sodium chloride 0.9 % bolus 1,000 mL ( Intravenous Stopped 06/22/19 1245)     Initial Impression / Assessment and Plan / ED Course  I have reviewed the triage vital signs and the nursing notes.  Pertinent labs & imaging results that were available during my care of the patient were reviewed by me and considered in my medical decision making (see chart for details).        Patient seen and examined. Labs ordered. Will perform pelvic.   Vital signs reviewed and are as follows: BP (!) 108/57 (BP Location: Left Arm)   Pulse 75   Temp 98.4 F (36.9 C) (Oral)   Resp 14   Ht 5\' 7"  (1.702 m)   Wt (!) 145.2 kg   SpO2 100%   BMI 50.12 kg/m   Pelvic exam performed with RN chaperone.    Given the patient has left adnexal tenderness and pelvic pain, ultrasound ordered.  I was informed that patient eloped from the emergency department due to childcare issues.  I was not  informed prior to her leaving.  Ultrasound was not performed.  I have sent Zofran into the patient's pharmacy.  Final Clinical Impressions(s) / ED Diagnoses   Final diagnoses:  Pelvic pain   Patient with pelvic pain, left adnexal pain on exam.  Labs are reassuring.  I would have like to perform an ultrasound to rule out ovarian torsion, ovarian cyst, however patient left prior to  exam.  I overall have a low concern for an emergent condition at this time.   ED Discharge Orders         Ordered    ondansetron (ZOFRAN ODT) 4 MG disintegrating tablet  Every 8 hours PRN     06/22/19 1514           Carlisle Cater, PA-C 06/22/19 1517    Blanchie Dessert, MD 07/03/19 1520

## 2019-06-22 NOTE — ED Triage Notes (Signed)
Abdominal pain x 2 days with N/V.  No diarrhea.  Some cold chills.

## 2019-06-22 NOTE — ED Notes (Signed)
Pt sts she needs to leave bc her babysitter cannot stay any longer. She verbalizes understanding re: AMA, ordered USs. Confirmed pharmacy with pt should there be a need to send rxs.

## 2019-06-22 NOTE — ED Notes (Signed)
Pt c/o HA that started after vomiting

## 2019-06-23 LAB — GC/CHLAMYDIA PROBE AMP (~~LOC~~) NOT AT ARMC
Chlamydia: NEGATIVE
Neisseria Gonorrhea: NEGATIVE

## 2021-12-20 ENCOUNTER — Emergency Department (HOSPITAL_BASED_OUTPATIENT_CLINIC_OR_DEPARTMENT_OTHER): Payer: Medicaid Other

## 2021-12-20 ENCOUNTER — Emergency Department (HOSPITAL_BASED_OUTPATIENT_CLINIC_OR_DEPARTMENT_OTHER)
Admission: EM | Admit: 2021-12-20 | Discharge: 2021-12-20 | Disposition: A | Discharge status: Home / Self Care | Payer: Medicaid Other | Attending: Emergency Medicine | Admitting: Emergency Medicine

## 2021-12-20 ENCOUNTER — Encounter (HOSPITAL_BASED_OUTPATIENT_CLINIC_OR_DEPARTMENT_OTHER): Payer: Self-pay | Admitting: Emergency Medicine

## 2021-12-20 ENCOUNTER — Other Ambulatory Visit: Payer: Self-pay

## 2021-12-20 DIAGNOSIS — N390 Urinary tract infection, site not specified: Secondary | ICD-10-CM | POA: Insufficient documentation

## 2021-12-20 DIAGNOSIS — R479 Unspecified speech disturbances: Secondary | ICD-10-CM | POA: Diagnosis not present

## 2021-12-20 DIAGNOSIS — Z8673 Personal history of transient ischemic attack (TIA), and cerebral infarction without residual deficits: Secondary | ICD-10-CM | POA: Diagnosis not present

## 2021-12-20 DIAGNOSIS — E041 Nontoxic single thyroid nodule: Secondary | ICD-10-CM | POA: Insufficient documentation

## 2021-12-20 DIAGNOSIS — R42 Dizziness and giddiness: Secondary | ICD-10-CM | POA: Diagnosis present

## 2021-12-20 HISTORY — DX: Anogenital (venereal) warts: A63.0

## 2021-12-20 LAB — CBC WITH DIFFERENTIAL/PLATELET
Abs Immature Granulocytes: 0.02 10*3/uL (ref 0.00–0.07)
Basophils Absolute: 0 10*3/uL (ref 0.0–0.1)
Basophils Relative: 0 %
Eosinophils Absolute: 0.1 10*3/uL (ref 0.0–0.5)
Eosinophils Relative: 1 %
HCT: 40.4 % (ref 36.0–46.0)
Hemoglobin: 13.2 g/dL (ref 12.0–15.0)
Immature Granulocytes: 0 %
Lymphocytes Relative: 27 %
Lymphs Abs: 2 10*3/uL (ref 0.7–4.0)
MCH: 28.6 pg (ref 26.0–34.0)
MCHC: 32.7 g/dL (ref 30.0–36.0)
MCV: 87.6 fL (ref 80.0–100.0)
Monocytes Absolute: 0.6 10*3/uL (ref 0.1–1.0)
Monocytes Relative: 8 %
Neutro Abs: 4.6 10*3/uL (ref 1.7–7.7)
Neutrophils Relative %: 64 %
Platelets: 299 10*3/uL (ref 150–400)
RBC: 4.61 MIL/uL (ref 3.87–5.11)
RDW: 13.2 % (ref 11.5–15.5)
WBC: 7.4 10*3/uL (ref 4.0–10.5)
nRBC: 0 % (ref 0.0–0.2)

## 2021-12-20 LAB — URINALYSIS, ROUTINE W REFLEX MICROSCOPIC
Bilirubin Urine: NEGATIVE
Glucose, UA: NEGATIVE mg/dL
Ketones, ur: NEGATIVE mg/dL
Nitrite: NEGATIVE
Protein, ur: NEGATIVE mg/dL
Specific Gravity, Urine: 1.025 (ref 1.005–1.030)
pH: 6 (ref 5.0–8.0)

## 2021-12-20 LAB — URINALYSIS, MICROSCOPIC (REFLEX)

## 2021-12-20 LAB — COMPREHENSIVE METABOLIC PANEL
ALT: 23 U/L (ref 0–44)
AST: 19 U/L (ref 15–41)
Albumin: 3.5 g/dL (ref 3.5–5.0)
Alkaline Phosphatase: 60 U/L (ref 38–126)
Anion gap: 7 (ref 5–15)
BUN: 11 mg/dL (ref 6–20)
CO2: 24 mmol/L (ref 22–32)
Calcium: 8.5 mg/dL — ABNORMAL LOW (ref 8.9–10.3)
Chloride: 106 mmol/L (ref 98–111)
Creatinine, Ser: 0.62 mg/dL (ref 0.44–1.00)
GFR, Estimated: >60 mL/min (ref >60)
Glucose, Bld: 94 mg/dL (ref 70–99)
Potassium: 4.1 mmol/L (ref 3.5–5.1)
Sodium: 137 mmol/L (ref 135–145)
Total Bilirubin: 0.6 mg/dL (ref 0.3–1.2)
Total Protein: 7.5 g/dL (ref 6.5–8.1)

## 2021-12-20 LAB — PREGNANCY, URINE: Preg Test, Ur: NEGATIVE

## 2021-12-20 MED ORDER — LACTATED RINGERS IV BOLUS
1000.0000 mL | Freq: Once | INTRAVENOUS | Status: AC
  Administered 2021-12-20: 1000 mL via INTRAVENOUS

## 2021-12-20 MED ORDER — CEPHALEXIN 500 MG PO CAPS: 500.0000 mg | ORAL_CAPSULE | Freq: Three times a day (TID) | ORAL | 0 refills | Status: AC

## 2021-12-20 MED ORDER — IOHEXOL 350 MG/ML SOLN
100.0000 mL | Freq: Once | INTRAVENOUS | Status: AC | PRN
  Administered 2021-12-20: 100 mL via INTRAVENOUS

## 2021-12-20 NOTE — ED Provider Notes (Signed)
?[facility] EMERGENCY DEPARTMENT ?Provider Note ? ? ?CSN: [id number] ?Arrival date & time: 12/20/21  [id number] ? ?  ? ?History ? ?Chief Complaint  ?Patient presents with  ? Dizziness  ? ? ?Barbara Smith is a 31 y.o. female. ? ?HPI ? ?  ? ?Woke up this morning feeling lightheaded and off, went to work and while she is walking around work she started to feel some dizziness, more like the room is spinning, this lasted at most 15 minutes. ? ?While talking with coworker began to have episode of slurred speech, just while she was trying to speak twice. Was difficult to understand. She texted her words, felt like she had her words but they sounded off and slurred for the 2 moments she tried to talk to her. Otherwise her speech has been normal.   ? ?Occasional smoking, occasional drinking, no other drugs.  ?No fam hx of early stroke ?No other significant medical history ?No headache, chest pain, shortness of breath, black or bloody stool, abdominal pain, nausea, vomiting, diarrhea, ringing in the ears, cough ? ?Past Medical History:  ?Diagnosis Date  ? Gastric ulcer   ? HPV (human papilloma virus) anogenital infection   ?  ? ?Home Medications ?Prior to Admission medications   ?Medication Sig Start Date End Date Taking? Authorizing Provider  ?cephALEXin (KEFLEX) 500 MG capsule Take 1 capsule (500 mg total) by mouth 3 (three) times daily for 10 days. 12/20/21 12/30/21 Yes Alvira MondaySchlossman, Reganne Messerschmidt, MD  ?cephALEXin (KEFLEX) 500 MG capsule Take 1 capsule (500 mg total) by mouth 2 (two) times daily. 04/27/14   Teressa LowerPickering, Vrinda, NP  ?metroNIDAZOLE (FLAGYL) 500 MG tablet Take 1 tablet (500 mg total) by mouth 2 (two) times daily. 04/27/14   Teressa LowerPickering, Vrinda, NP  ?ondansetron (ZOFRAN ODT) 4 MG disintegrating tablet Take 1 tablet (4 mg total) by mouth every 8 (eight) hours as needed for nausea or vomiting. 06/22/19   Renne CriglerGeiple, Joshua, PA-C  ?   ? ?Allergies    ?Patient has no known allergies.   ? ?Review of Systems   ?Review of  Systems ? ?Physical Exam ?Updated Vital Signs ?BP 135/88   Pulse 83   Temp 98.1 ?F (36.7 ?C) (Oral)   Resp 14   Ht 5\' 7"  (1.702 m)   Wt (!) 155.1 kg   SpO2 100%   BMI 53.56 kg/m?  ?Physical Exam ?Vitals and nursing note reviewed.  ?Constitutional:   ?   General: She is not in acute distress. ?   Appearance: Normal appearance. She is well-developed. She is not ill-appearing or diaphoretic.  ?HENT:  ?   Head: Normocephalic and atraumatic.  ?Eyes:  ?   General: No visual field deficit. ?   Extraocular Movements: Extraocular movements intact.  ?   Conjunctiva/sclera: Conjunctivae normal.  ?   Pupils: Pupils are equal, round, and reactive to light.  ?Cardiovascular:  ?   Rate and Rhythm: Normal rate and regular rhythm.  ?   Pulses: Normal pulses.  ?   Heart sounds: Normal heart sounds. No murmur heard. ?  No friction rub. No gallop.  ?Pulmonary:  ?   Effort: Pulmonary effort is normal. No respiratory distress.  ?   Breath sounds: Normal breath sounds. No wheezing or rales.  ?Abdominal:  ?   General: There is no distension.  ?   Palpations: Abdomen is soft.  ?   Tenderness: There is no abdominal tenderness. There is no guarding.  ?Musculoskeletal:     ?  General: No swelling or tenderness.  ?   Cervical back: Normal range of motion.  ?Skin: ?   General: Skin is warm and dry.  ?   Findings: No erythema or rash.  ?Neurological:  ?   General: No focal deficit present.  ?   Mental Status: She is alert and oriented to person, place, and time.  ?   GCS: GCS eye subscore is 4. GCS verbal subscore is 5. GCS motor subscore is 6.  ?   Cranial Nerves: No cranial nerve deficit, dysarthria or facial asymmetry.  ?   Sensory: No sensory deficit.  ?   Motor: No weakness or tremor.  ?   Coordination: Coordination normal. Finger-Nose-Finger Test normal.  ?   Gait: Gait normal.  ?   Comments: Denies numbness, does state that on right side "felt sort of different at the end" of palpation/timing, not persistent deficit ?  ? ? ?ED  Results / Procedures / Treatments   ?Labs ?(all labs ordered are listed, but only abnormal results are displayed) ?Labs Reviewed  ?COMPREHENSIVE METABOLIC PANEL - Abnormal; Notable for the following components:  ?    Result Value  ? Calcium 8.5 (*)   ? All other components within normal limits  ?URINALYSIS, ROUTINE W REFLEX MICROSCOPIC - Abnormal; Notable for the following components:  ? APPearance HAZY (*)   ? Hgb urine dipstick SMALL (*)   ? Leukocytes,Ua MODERATE (*)   ? All other components within normal limits  ?URINALYSIS, MICROSCOPIC (REFLEX) - Abnormal; Notable for the following components:  ? Bacteria, UA MANY (*)   ? All other components within normal limits  ?CBC WITH DIFFERENTIAL/PLATELET  ?PREGNANCY, URINE  ? ? ?EKG ?EKG Interpretation ? ?Date/Time:  Thursday December 20 2021 08:17:51 EDT ?Ventricular Rate:  83 ?PR Interval:  192 ?QRS Duration: 89 ?QT Interval:  377 ?QTC Calculation: 443 ?R Axis:   64 ?Text Interpretation: Sinus rhythm No significant change since last tracing Confirmed by Alvira Monday ([id number]) on 12/20/2021 9:59:14 AM ? ?Radiology ?CT ANGIO HEAD NECK W WO CM ? ?Result Date: 12/20/2021 ?CLINICAL DATA:  Transient ischemic attack (TIA) EXAM: CT ANGIOGRAPHY HEAD AND NECK TECHNIQUE: Multidetector CT imaging of the head and neck was performed using the standard protocol during bolus administration of intravenous contrast. Multiplanar CT image reconstructions and MIPs were obtained to evaluate the vascular anatomy. Carotid stenosis measurements (when applicable) are obtained utilizing NASCET criteria, using the distal internal carotid diameter as the denominator. RADIATION DOSE REDUCTION: This exam was performed according to the departmental dose-optimization program which includes automated exposure control, adjustment of the mA and/or kV according to patient size and/or use of iterative reconstruction technique. CONTRAST:  OMNIPAQUE IOHEXOL 350 MG/ML SOLN COMPARISON:  None. FINDINGS: CT  HEAD FINDINGS Brain: No evidence of acute large vascular territory infarction, hemorrhage, hydrocephalus, extra-axial collection or mass lesion/mass effect. Vascular: No hyperdense vessel identified. Skull: No acute fracture. Sinuses: Clear visualized sinuses. Orbits: No acute findings. Review of the MIP images confirms the above findings CTA NECK FINDINGS Aortic arch: Poorly visualized due to streak and beam hardening artifact Right carotid system: No evidence of dissection, stenosis (50% or greater) or occlusion. Left carotid system: Poorly evaluated proximal common carotid artery due to streak and beam hardening artifact. No evidence of dissection, stenosis (50% or greater) or occlusion. Vertebral arteries: Mild left dominant. No evidence of dissection, stenosis (50% or greater) or occlusion. Skeleton: No acute findings. Other neck: No acute findings. Upper chest: Visualized lung apices are  clear. Review of the MIP images confirms the above findings CTA HEAD FINDINGS Anterior circulation: Bilateral intracranial ICAs, MCAs, and ACAs are patent without proximal hemodynamically significant stenosis. No aneurysm identified. Of are Posterior circulation: Bilateral intradural vertebral arteries, basilar artery, and posterior cerebral arteries are patent without proximal hemodynamically significant stenosis. No aneurysm identified. Venous sinuses: No evidence of dural venous sinus thrombosis. Review of the MIP images confirms the above findings IMPRESSION: 1. No evidence of acute intracranial abnormality. 2. No emergent large vessel occlusion or proximal hemodynamically significant stenosis in the head or neck. 3. Approximately 1.5 cm left thyroid nodule. Recommend thyroid US (ref: J Am Coll Radiol. 2015 Feb;12(2): 143-50). Electronically Signed   By: Feliberto Harts M.D.   On: 12/20/2021 09:54   ? ?Procedures ?Procedures  ? ? ?Medications Ordered in ED ?Medications  ?lactated ringers bolus 1,000 mL (0 mLs Intravenous  Stopped 12/20/21 1058)  ?iohexol (OMNIPAQUE) 350 MG/ML injection 100 mL (100 mLs Intravenous Contrast Given 12/20/21 0933)  ? ? ?ED Course/ Medical Decision Making/ A&P ?  ?                        ?Medical Decision Making ?Amo

## 2021-12-20 NOTE — ED Triage Notes (Addendum)
States woke up dizzy this am , still drove to work and it has gotten worse, worse states she is stumbling and feels like her speech is impaired,  denies h/a,  sob or cp , states just saw her dr  last Monday for her  [facility] shot states not sure if she is preg states feels dehydrated  denies vomiting ?

## 2021-12-21 ENCOUNTER — Telehealth (HOSPITAL_COMMUNITY): Payer: Self-pay | Admitting: Emergency Medicine

## 2021-12-21 NOTE — Progress Notes (Signed)
TOC CSW received a consult in regard to a PCP appointment. CSW spoke with pt to inform her she will have to contact [employer] to get another provider attached to her Medicaid. CSW provided pt with the [employer] contact number and additional providers that are accepting new pt and will take her Medicaid. Pt provided a verbal understanding of the process. TOC sign off. ? ?Barbara Smith.[name], MSW, LCSWA ?[facility]  Transitions of Care ?Clinical Social Worker I ?Direct Dial: [phone]  Fax: [phone] ?[email or web address]  ?

## 2022-03-25 ENCOUNTER — Emergency Department (HOSPITAL_BASED_OUTPATIENT_CLINIC_OR_DEPARTMENT_OTHER)
Admission: EM | Admit: 2022-03-25 | Discharge: 2022-03-25 | Disposition: A | Discharge status: Home / Self Care | Payer: Medicaid Other | Attending: Emergency Medicine | Admitting: Emergency Medicine

## 2022-03-25 ENCOUNTER — Encounter (HOSPITAL_BASED_OUTPATIENT_CLINIC_OR_DEPARTMENT_OTHER): Payer: Self-pay

## 2022-03-25 DIAGNOSIS — N898 Other specified noninflammatory disorders of vagina: Secondary | ICD-10-CM | POA: Diagnosis present

## 2022-03-25 DIAGNOSIS — N760 Acute vaginitis: Secondary | ICD-10-CM | POA: Diagnosis not present

## 2022-03-25 DIAGNOSIS — A599 Trichomoniasis, unspecified: Secondary | ICD-10-CM | POA: Diagnosis not present

## 2022-03-25 DIAGNOSIS — B9689 Other specified bacterial agents as the cause of diseases classified elsewhere: Secondary | ICD-10-CM

## 2022-03-25 LAB — URINALYSIS, ROUTINE W REFLEX MICROSCOPIC
Bilirubin Urine: NEGATIVE
Glucose, UA: NEGATIVE mg/dL
Ketones, ur: NEGATIVE mg/dL
Nitrite: NEGATIVE
Protein, ur: 30 mg/dL — AB
Specific Gravity, Urine: >=1.03 (ref 1.005–1.030)
pH: 5 (ref 5.0–8.0)

## 2022-03-25 LAB — WET PREP, GENITAL
Sperm: NONE SEEN
WBC, Wet Prep HPF POC: >=10 — AB (ref <10)
Yeast Wet Prep HPF POC: NONE SEEN

## 2022-03-25 LAB — PREGNANCY, URINE: Preg Test, Ur: NEGATIVE

## 2022-03-25 LAB — URINALYSIS, MICROSCOPIC (REFLEX): WBC, UA: >50 WBC/hpf (ref 0–5)

## 2022-03-25 MED ORDER — ONDANSETRON 4 MG PO TBDP: 4.0000 mg | ORAL_TABLET | Freq: Three times a day (TID) | ORAL | 0 refills | Status: AC | PRN

## 2022-03-25 MED ORDER — CEFTRIAXONE SODIUM 1 G IJ SOLR
1.0000 g | Freq: Once | INTRAMUSCULAR | Status: AC
  Administered 2022-03-25: 1 g via INTRAMUSCULAR
  Filled 2022-03-25: qty 10

## 2022-03-25 MED ORDER — METRONIDAZOLE 500 MG PO TABS: 500.0000 mg | ORAL_TABLET | Freq: Two times a day (BID) | ORAL | 0 refills | Status: AC

## 2022-03-25 MED ORDER — LIDOCAINE HCL (PF) 1 % IJ SOLN
1.0000 mL | Freq: Once | INTRAMUSCULAR | Status: AC
  Administered 2022-03-25: 2.1 mL
  Filled 2022-03-25: qty 5

## 2022-03-25 MED ORDER — ONDANSETRON 4 MG PO TBDP
4.0000 mg | ORAL_TABLET | Freq: Once | ORAL | Status: AC
  Administered 2022-03-25: 4 mg via ORAL
  Filled 2022-03-25: qty 1

## 2022-03-25 MED ORDER — DOXYCYCLINE HYCLATE 100 MG PO TABS: 100.0000 mg | ORAL_TABLET | Freq: Two times a day (BID) | ORAL | 0 refills | Status: AC

## 2022-03-25 NOTE — ED Notes (Signed)
Discharge instructions and recommendations reviewed with patient. Questions answered. states understanding. Pt discharged home

## 2022-03-25 NOTE — ED Triage Notes (Signed)
C/o pelvic pain, vaginal pain and copious amounts of vaginal discharge. Took 5 days of boric acid for what she thought was BV with no improvement. Thinks she has an STD. Burning with urination.

## 2022-03-25 NOTE — Discharge Instructions (Addendum)
Today you have been tested for gonorrhea and chlamydia.  The test to determine if you have these will take a few days. They will only call you if your tests come back positive, no news is good news. In the result that your tests are positive you have already been treated.  As part of your treatment please take the antibiotic, Doxycycline, every 12 hours until gone. A side effect of this medication includes hypersensitivity to the suns rays - please take measures to protect your skin from the sun while taking this medication.  You were also found to be positive for bacterial vaginosis and trichomonas.  Due to this you were started on the medication Flagyl.  It is very important that you do not combine alcohol while taking this medication as it can make you violently ill.  If you change your mind and want testing for HIV or syphilis please go to the health department for further testing.  Please do not have any sexual activity for the next two weeks.    After that please make sure that you always use a condom every time you have sex.  Please tell your sexual partners to seek treatment for trichomonas.  If you have any new or concerning symptoms please seek additional medical care and evaluation.

## 2022-03-25 NOTE — ED Provider Notes (Signed)
[facility] EMERGENCY DEPARTMENT Provider Note   CSN: [id number] Arrival date & time: 03/25/22  1443     History  Chief Complaint  Patient presents with   Vaginal Discharge    Barbara Smith is a 31 y.o. female with a history of HPV, genital herpes, gastric ulcer.  Presents emergency department complaint of vaginal discharge.  Patient reports that she started having vaginal discharge 7 days ago.  Describes discharge as green to yellow in color.  Patient reports that she is also been dealing with pelvic cramping and vaginal pain over the last 5 days.  Patient's cramping, vaginal pain, and vaginal discharge have been getting progressively worse over the last 3 days.  Patient states that she tried 5 days of boric acid due to concern for possible BV with no improvement in symptoms.  Patient notes that she has noticed minimal bleeding when wiping after urination.  Patient also reports dysuria, urinary frequency, and urinary urgency.  Patient reports that he she is sexually active with multiple female partners.  Patient states that she does not use condoms for penetrative vaginal intercourse with 1 female partners.  Patient reports that she does not have regular menstrual periods due to receiving Depo-Provera shot.  Patient reports that she takes valacyclovir on a daily basis to prevent herpes outbreak.  Patient denies any abdominal pain, nausea, vomiting, diarrhea, fever, chills.      Vaginal Discharge Associated symptoms: no abdominal pain, no dysuria, no fever, no nausea and no vomiting        Home Medications Prior to Admission medications   Medication Sig Start Date End Date Taking? Authorizing Provider  cephALEXin (KEFLEX) 500 MG capsule Take 1 capsule (500 mg total) by mouth 2 (two) times daily. 04/27/14   Teressa Lower, NP  metroNIDAZOLE (FLAGYL) 500 MG tablet Take 1 tablet (500 mg total) by mouth 2 (two) times daily. 04/27/14   Teressa Lower, NP  ondansetron (ZOFRAN  ODT) 4 MG disintegrating tablet Take 1 tablet (4 mg total) by mouth every 8 (eight) hours as needed for nausea or vomiting. 06/22/19   Renne Crigler, PA-C      Allergies    Patient has no known allergies.    Review of Systems   Review of Systems  Constitutional:  Negative for chills and fever.  Respiratory:  Negative for shortness of breath.   Cardiovascular:  Negative for chest pain.  Gastrointestinal:  Negative for abdominal pain, nausea and vomiting.  Genitourinary:  Positive for frequency, pelvic pain, urgency, vaginal bleeding, vaginal discharge and vaginal pain. Negative for difficulty urinating, dysuria, flank pain, genital sores and hematuria.  Musculoskeletal:  Negative for back pain and neck pain.  Skin:  Negative for color change and rash.  Neurological:  Negative for dizziness, syncope, light-headedness and headaches.  Psychiatric/Behavioral:  Negative for confusion.     Physical Exam Updated Vital Signs BP 119/88   Pulse 84   Temp 98.5 F (36.9 C) (Oral)   Resp 18   Ht 5' 7.5" (1.715 m)   Wt (!) 152 kg   SpO2 100%   BMI 51.69 kg/m  Physical Exam Vitals and nursing note reviewed. Exam conducted with a chaperone present (Female RN present as chaperone).  Constitutional:      General: She is not in acute distress.    Appearance: She is morbidly obese. She is not ill-appearing, toxic-appearing or diaphoretic.  HENT:     Head: Normocephalic.  Eyes:     General: No scleral icterus.  Right eye: No discharge.        Left eye: No discharge.  Cardiovascular:     Rate and Rhythm: Normal rate.  Pulmonary:     Effort: Pulmonary effort is normal.  Abdominal:     General: There is no distension. There are no signs of injury.     Palpations: Abdomen is soft. There is no mass or pulsatile mass.     Tenderness: There is no abdominal tenderness. There is no guarding or rebound.     Hernia: There is no hernia in the left inguinal area or right inguinal area.   Genitourinary:    Exam position: Lithotomy position.     Pubic Area: No rash or pubic lice.      Tanner stage (genital): 5.     Labia:        Right: No rash, tenderness, lesion or injury.        Left: No rash, tenderness, lesion or injury.      Vagina: No signs of injury and foreign body. Vaginal discharge present. No erythema, tenderness, bleeding, lesions or prolapsed vaginal walls.     Cervix: Discharge present. No cervical motion tenderness, friability, lesion, erythema, cervical bleeding or eversion.     Uterus: Not enlarged and not tender.      Adnexa: Right adnexa normal and left adnexa normal.     Comments: Copious amounts of green discharge noted within vaginal vault.  No cervical motion tenderness. Lymphadenopathy:     Lower Body: No right inguinal adenopathy. No left inguinal adenopathy.  Skin:    General: Skin is warm and dry.  Neurological:     General: No focal deficit present.     Mental Status: She is alert.  Psychiatric:        Behavior: Behavior is cooperative.     ED Results / Procedures / Treatments   Labs (all labs ordered are listed, but only abnormal results are displayed) Labs Reviewed  URINALYSIS, ROUTINE W REFLEX MICROSCOPIC - Abnormal; Notable for the following components:      Result Value   APPearance CLOUDY (*)    Hgb urine dipstick MODERATE (*)    Protein, ur 30 (*)    Leukocytes,Ua LARGE (*)    All other components within normal limits  URINALYSIS, MICROSCOPIC (REFLEX) - Abnormal; Notable for the following components:   Bacteria, UA FEW (*)    Trichomonas, UA PRESENT (*)    All other components within normal limits  PREGNANCY, URINE    EKG None  Radiology No results found.  Procedures Procedures    Medications Ordered in ED Medications  ondansetron (ZOFRAN-ODT) disintegrating tablet 4 mg (has no administration in time range)  cefTRIAXone (ROCEPHIN) injection 1 g (has no administration in time range)  lidocaine (PF) (XYLOCAINE)  1 % injection 1-2.1 mL (has no administration in time range)    ED Course/ Medical Decision Making/ A&P                           Medical Decision Making Amount and/or Complexity of Data Reviewed Labs: ordered.  Risk Prescription drug management.   Alert 31 year old female in no acute distress, nontoxic-appearing.  Presents to the ED with a chief complaint of vaginal discharge.  Information was obtained from patient.  I reviewed patient's past medical records including previous provider notes, labs, and imaging.  Patient has medical history as outlined in HPI which complicates her care.  With reports of vaginal  discharge differential diagnosis includes but is not limited to STI, BV, yeast infection, UTI, pregnancy, ectopic pregnancy, PID.  Will obtain urine pregnancy test, urinalysis, GC chlamydia testing, wet prep.  On physical exam patient has no cervical motion tenderness or tenderness to adnexa bilaterally.  Low suspicion for PID at this time.  I personally viewed and interpreted patient's lab results.  Pertinent findings include: -Urinalysis shows bacteria few, WBC greater than 50, RBC 6-10, leukocyte large, nitrite negative, trichomonas present. -Urine pregnancy test negative -Wet prep shows trichomonas and clue cells  Patient will be treated with 7-day course of Flagyl for trichomonas and BV.  Shared decision making with patient about empiric treatment for gonorrhea and chlamydia.  Patient elects for empiric treatment at this time.  Patient denied any testing for HIV or syphilis.  Based on patient's chief complaint, I considered admission might be necessary, however after reassuring ED workup feel patient is reasonable for discharge.  Discussed results, findings, treatment and follow up. Patient advised of return precautions. Patient verbalized understanding and agreed with plan.  Portions of this note were generated with Scientist, clinical (histocompatibility and immunogenetics). Dictation errors may occur  despite best attempts at proofreading.         Final Clinical Impression(s) / ED Diagnoses Final diagnoses:  BV (bacterial vaginosis)  Trichomonas infection    Rx / DC Orders ED Discharge Orders          Ordered    ondansetron (ZOFRAN-ODT) 4 MG disintegrating tablet  Every 8 hours PRN        03/25/22 1710    doxycycline (VIBRA-TABS) 100 MG tablet  2 times daily        03/25/22 1711    metroNIDAZOLE (FLAGYL) 500 MG tablet  2 times daily        03/25/22 1712              Haskel Schroeder, PA-C 03/25/22 1715    Franne Forts, DO 03/28/22 [phone]

## 2022-03-26 LAB — GC/CHLAMYDIA PROBE AMP (~~LOC~~) NOT AT ARMC
Chlamydia: POSITIVE — AB
Comment: NEGATIVE
Comment: NORMAL
Neisseria Gonorrhea: NEGATIVE

## 2022-08-26 IMAGING — CT CT ANGIO HEAD-NECK (W OR W/O PERF)
1 of 11 series · 5 of 33 positions shown · non-contrast
Comparison: None.

CLINICAL DATA: Transient ischemic attack (TIA)

EXAM:
CT ANGIOGRAPHY HEAD AND NECK
TECHNIQUE: Multidetector CT imaging of the head and neck was performed using
the standard protocol during bolus administration of intravenous
contrast. Multiplanar CT image reconstructions and MIPs were
obtained to evaluate the vascular anatomy. Carotid stenosis
measurements (when applicable) are obtained utilizing NASCET
criteria, using the distal internal carotid diameter as the
denominator.

[Series 11: axial thin · axial · 0.43mm/px · z∈[+1113,+1365]mm · 5 of 380 slices shown]
[im 64/380  soft-tissue]
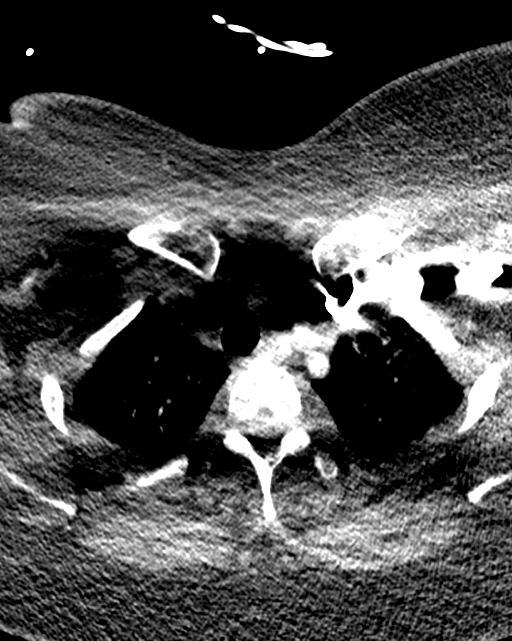
[im 127/380  bone]
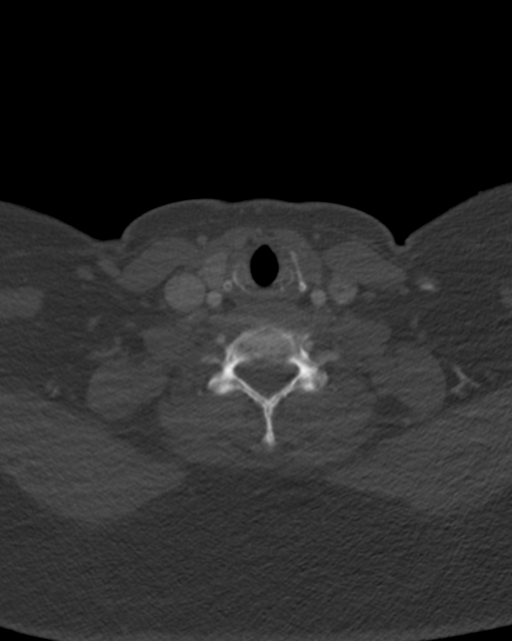
[im 190/380  soft-tissue]
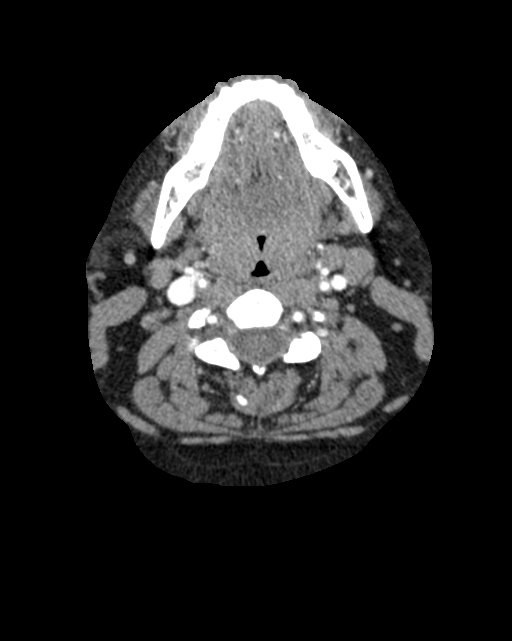
[im 253/380  bone]
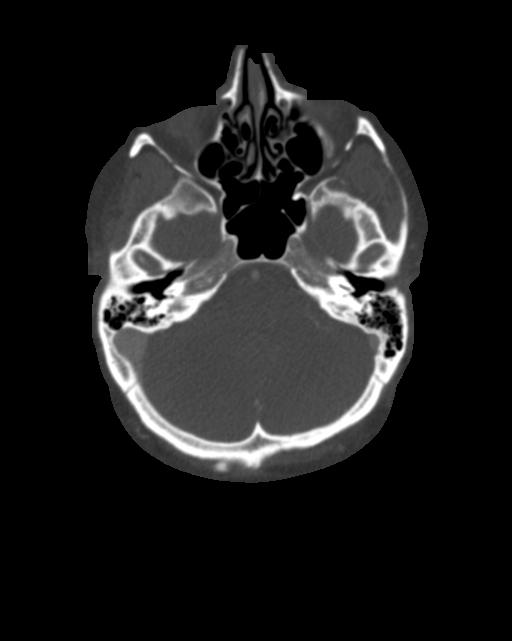
[im 316/380  soft-tissue]
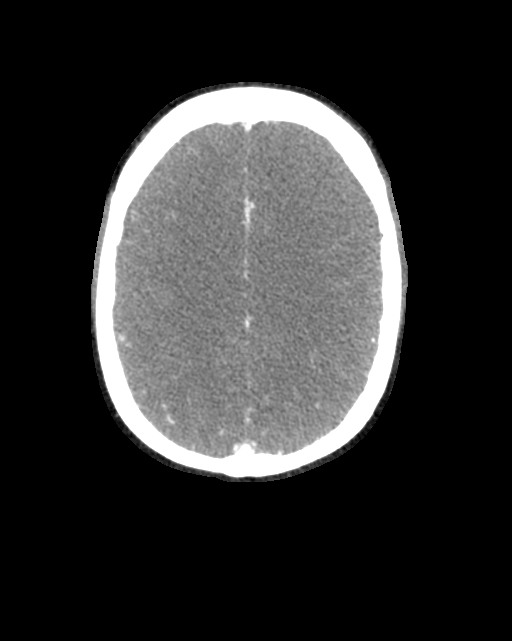

[5 of 33 positions shown; findings below may reference images not displayed]

RADIATION DOSE REDUCTION: This exam was performed according to the
departmental dose-optimization program which includes automated
exposure control, adjustment of the mA and/or kV according to
patient size and/or use of iterative reconstruction technique.

CONTRAST:  100mL OMNIPAQUE IOHEXOL 350 MG/ML SOLN
FINDINGS: CT HEAD FINDINGS

Brain: No evidence of acute large vascular territory infarction,
hemorrhage, hydrocephalus, extra-axial collection or mass
lesion/mass effect.

Vascular: No hyperdense vessel identified.

Skull: No acute fracture.

Sinuses: Clear visualized sinuses.

Orbits: No acute findings.

Review of the MIP images confirms the above findings

CTA NECK FINDINGS

Aortic arch: Poorly visualized due to streak and beam hardening
artifact

Right carotid system: No evidence of dissection, stenosis (50% or
greater) or occlusion.

Left carotid system: Poorly evaluated proximal common carotid artery
due to streak and beam hardening artifact. No evidence of
dissection, stenosis (50% or greater) or occlusion.

Vertebral arteries: Mild left dominant. No evidence of dissection,
stenosis (50% or greater) or occlusion.

Skeleton: No acute findings.

Other neck: No acute findings.

Upper chest: Visualized lung apices are clear.

Review of the MIP images confirms the above findings

CTA HEAD FINDINGS

Anterior circulation: Bilateral intracranial ICAs, MCAs, and ACAs
are patent without proximal hemodynamically significant stenosis. No
aneurysm identified. Of are

Posterior circulation: Bilateral intradural vertebral arteries,
basilar artery, and posterior cerebral arteries are patent without
proximal hemodynamically significant stenosis. No aneurysm
identified.

Venous sinuses: No evidence of dural venous sinus thrombosis.

Review of the MIP images confirms the above findings
IMPRESSION: 1. No evidence of acute intracranial abnormality.
2. No emergent large vessel occlusion or proximal hemodynamically
significant stenosis in the head or neck.
3. Approximately 1.5 cm left thyroid nodule. Recommend thyroid US
(ref: [HOSPITAL]. [DATE]): 143-50).

## 2022-12-04 ENCOUNTER — Emergency Department (HOSPITAL_BASED_OUTPATIENT_CLINIC_OR_DEPARTMENT_OTHER)
Admission: EM | Admit: 2022-12-04 | Discharge: 2022-12-04 | Disposition: A | Discharge status: Home / Self Care | Payer: Medicaid Other | Attending: Emergency Medicine | Admitting: Emergency Medicine

## 2022-12-04 ENCOUNTER — Encounter (HOSPITAL_BASED_OUTPATIENT_CLINIC_OR_DEPARTMENT_OTHER): Payer: Self-pay | Admitting: Emergency Medicine

## 2022-12-04 DIAGNOSIS — N898 Other specified noninflammatory disorders of vagina: Secondary | ICD-10-CM | POA: Insufficient documentation

## 2022-12-04 DIAGNOSIS — B3731 Acute candidiasis of vulva and vagina: Secondary | ICD-10-CM

## 2022-12-04 DIAGNOSIS — Z711 Person with feared health complaint in whom no diagnosis is made: Secondary | ICD-10-CM

## 2022-12-04 DIAGNOSIS — Z202 Contact with and (suspected) exposure to infections with a predominantly sexual mode of transmission: Secondary | ICD-10-CM | POA: Insufficient documentation

## 2022-12-04 LAB — URINALYSIS, MICROSCOPIC (REFLEX)

## 2022-12-04 LAB — WET PREP, GENITAL
Clue Cells Wet Prep HPF POC: NONE SEEN
Sperm: NONE SEEN
Trich, Wet Prep: NONE SEEN
WBC, Wet Prep HPF POC: >=10 — AB (ref <10)

## 2022-12-04 LAB — URINALYSIS, ROUTINE W REFLEX MICROSCOPIC
Bilirubin Urine: NEGATIVE
Glucose, UA: NEGATIVE mg/dL
Hgb urine dipstick: NEGATIVE
Ketones, ur: NEGATIVE mg/dL
Nitrite: NEGATIVE
Protein, ur: 30 mg/dL — AB
Specific Gravity, Urine: 1.025 (ref 1.005–1.030)
pH: 6 (ref 5.0–8.0)

## 2022-12-04 LAB — PREGNANCY, URINE: Preg Test, Ur: NEGATIVE

## 2022-12-04 LAB — HIV ANTIBODY (ROUTINE TESTING W REFLEX): HIV Screen 4th Generation wRfx: NONREACTIVE

## 2022-12-04 MED ORDER — FLUCONAZOLE 150 MG PO TABS
150.0000 mg | ORAL_TABLET | Freq: Once | ORAL | Status: AC
  Administered 2022-12-04: 150 mg via ORAL
  Filled 2022-12-04: qty 1

## 2022-12-04 MED ORDER — LIDOCAINE HCL (PF) 1 % IJ SOLN
1.0000 mL | Freq: Once | INTRAMUSCULAR | Status: AC
  Administered 2022-12-04: 1 mL
  Filled 2022-12-04: qty 5

## 2022-12-04 MED ORDER — CEFTRIAXONE SODIUM 1 G IJ SOLR
1.0000 g | Freq: Once | INTRAMUSCULAR | Status: AC
  Administered 2022-12-04: 1 g via INTRAMUSCULAR
  Filled 2022-12-04: qty 10

## 2022-12-04 MED ORDER — FLUCONAZOLE 150 MG PO TABS: 150.0000 mg | ORAL_TABLET | Freq: Every day | ORAL | 0 refills | Status: AC

## 2022-12-04 MED ORDER — DOXYCYCLINE HYCLATE 100 MG PO TABS
100.0000 mg | ORAL_TABLET | Freq: Once | ORAL | Status: AC
  Administered 2022-12-04: 100 mg via ORAL
  Filled 2022-12-04: qty 1

## 2022-12-04 MED ORDER — DOXYCYCLINE HYCLATE 100 MG PO TABS: 100.0000 mg | ORAL_TABLET | Freq: Two times a day (BID) | ORAL | 0 refills | Status: AC

## 2022-12-04 NOTE — ED Notes (Signed)
Patient has a urine specimen in the lab

## 2022-12-04 NOTE — ED Triage Notes (Signed)
Pt w/ vaginal discharge/odor, dysuria and pelvic cramping; possible exposure to STD

## 2022-12-04 NOTE — ED Provider Notes (Addendum)
  Physical Exam  BP (!) 142/92 (BP Location: Right Wrist)   Pulse (!) 113   Temp 98 F (36.7 C) (Oral)   Resp 20   Ht 5\' 7"  (1.702 m)   Wt (!) 158.8 kg   LMP 11/24/2022   SpO2 99%   BMI 54.82 kg/m     Procedures  Procedures  ED Course / MDM   Clinical Course as of 12/04/22 1533  Wed Dec 04, 2022  1526 Yeast Wet Prep HPF POC(!): PRESENT [JL]    Clinical Course User Index [JL] Regan Lemming, MD   Medical Decision Making Amount and/or Complexity of Data Reviewed Labs: ordered. Decision-making details documented in ED Course.  Risk Prescription drug management.   82F, presenting with cocnern for STI, vaginal cramping, discharge, waiting on labs. Consents to empiric tx and testing. Results pending.  Wet prep reveals yeast present. Will treat with Diflucan. Will also provide Doxy for STI empiric tx. Received Rocephin.  Patient is well-appearing, tolerating oral intake, nontoxic-appearing.  Provided return precautions the event of signs and symptoms of PID, worsening symptoms that would require further resuscitation inpatient.  Will trial a course of outpatient management after discussion with the patient, follow-up on GC/chlamydia testing outpatient, stable for DC.       Regan Lemming, MD 12/04/22 1534    Regan Lemming, MD 12/04/22 [phone]

## 2022-12-04 NOTE — ED Provider Notes (Signed)
[facility] Provider Note   CSN: [id number] Arrival date & time: 12/04/22  1409     History  Chief Complaint  Patient presents with   Vaginal Discharge    Barbara Smith is a 32 y.o. female.  Patient concerned about possible STD.  Patient with vaginal discharge odor and dysuria and some mild pelvic cramping for about a week.  No fevers.  No rash.  No nausea or vomiting.  Patient with a history of gastric ulcer and HPV.  Patient is an everyday smoker.  Patient seen in July 2023 with bacterial vaginosis.       Home Medications Prior to Admission medications   Medication Sig Start Date End Date Taking? Authorizing Provider  doxycycline (VIBRA-TABS) 100 MG tablet Take 1 tablet (100 mg total) by mouth 2 (two) times daily for 7 days. 12/04/22 12/11/22 Yes Fredia Sorrow, MD  ondansetron (ZOFRAN-ODT) 4 MG disintegrating tablet Take 1 tablet (4 mg total) by mouth every 8 (eight) hours as needed for nausea or vomiting. 03/25/22   Loni Beckwith, PA-C      Allergies    Patient has no known allergies.    Review of Systems   Review of Systems  Constitutional:  Negative for chills and fever.  HENT:  Negative for ear pain and sore throat.   Eyes:  Negative for pain and visual disturbance.  Respiratory:  Negative for cough and shortness of breath.   Cardiovascular:  Negative for chest pain and palpitations.  Gastrointestinal:  Positive for abdominal pain. Negative for nausea and vomiting.  Genitourinary:  Positive for dysuria and vaginal discharge. Negative for hematuria.  Musculoskeletal:  Negative for arthralgias and back pain.  Skin:  Negative for color change and rash.  Neurological:  Negative for seizures and syncope.  All other systems reviewed and are negative.   Physical Exam Updated Vital Signs BP (!) 142/92 (BP Location: Right Wrist)   Pulse (!) 113   Temp 98 F (36.7 C) (Oral)   Resp 20   Ht 1.702 m (5\' 7" )   Wt (!)  158.8 kg   LMP 11/24/2022   SpO2 99%   BMI 54.82 kg/m  Physical Exam Vitals and nursing note reviewed.  Constitutional:      General: She is not in acute distress.    Appearance: Normal appearance. She is well-developed.  HENT:     Head: Normocephalic and atraumatic.  Eyes:     Extraocular Movements: Extraocular movements intact.     Conjunctiva/sclera: Conjunctivae normal.     Pupils: Pupils are equal, round, and reactive to light.  Cardiovascular:     Rate and Rhythm: Normal rate and regular rhythm.     Heart sounds: No murmur heard. Pulmonary:     Effort: Pulmonary effort is normal. No respiratory distress.     Breath sounds: Normal breath sounds.  Abdominal:     Palpations: Abdomen is soft.     Tenderness: There is no abdominal tenderness. There is no guarding.     Comments: Abdomen soft and nontender.  Genitourinary:    Vagina: Vaginal discharge present.  Musculoskeletal:        General: No swelling.     Cervical back: Normal range of motion and neck supple.  Skin:    General: Skin is warm and dry.     Capillary Refill: Capillary refill takes less than 2 seconds.  Neurological:     General: No focal deficit present.  Mental Status: She is alert and oriented to person, place, and time.     Cranial Nerves: No cranial nerve deficit.     Sensory: No sensory deficit.     Motor: No weakness.  Psychiatric:        Mood and Affect: Mood normal.     ED Results / Procedures / Treatments   Labs (all labs ordered are listed, but only abnormal results are displayed) Labs Reviewed  WET PREP, GENITAL  RPR  URINALYSIS, ROUTINE W REFLEX MICROSCOPIC  PREGNANCY, URINE  HIV ANTIBODY (ROUTINE TESTING W REFLEX)  GC/CHLAMYDIA PROBE AMP ([facility]) NOT AT [facility]    EKG None  Radiology No results found.  Procedures Procedures    Medications Ordered in ED Medications  cefTRIAXone (ROCEPHIN) injection 1 g (has no administration in time range)  lidocaine (PF)  (XYLOCAINE) 1 % injection 1-2.1 mL (has no administration in time range)    ED Course/ Medical Decision Making/ A&P                             Medical Decision Making Amount and/or Complexity of Data Reviewed Labs: ordered.  Risk Prescription drug management.   Will have patient self swab for wet prep and for GC and chlamydia.  Patient will be treated for STD exposure.  Urine pregnancy test still pending.  Abdomen soft nontender no concerns clinically for PID.  Patient's main concern is for STD.  Patient has had bacterial vaginosis on exams in the past.  If pregnancy test negative patient will be continue with doxycycline at home.  Will receive Rocephin here.  Based on wet prep may require Flagyl as well.   Final Clinical Impression(s) / ED Diagnoses Final diagnoses:  Vaginal discharge  Concern about STD in female without diagnosis    Rx / DC Orders ED Discharge Orders          Ordered    doxycycline (VIBRA-TABS) 100 MG tablet  2 times daily        12/04/22 1444              Fredia Sorrow, MD 12/04/22 1448

## 2022-12-04 NOTE — Discharge Instructions (Addendum)
Concerns for STD exposures been treated with Rocephin and take the Doxy cyclin antibiotic as directed for the next 7 days.  Return for any new or worse symptoms.  Return for any worse abdominal pain.  Return for fevers nausea vomiting. Your wet prep revealed yeast present, will treat with Diflucan. If still with white discharge in 3 days, take another tablet.

## 2022-12-05 LAB — GC/CHLAMYDIA PROBE AMP (~~LOC~~) NOT AT ARMC
Chlamydia: NEGATIVE
Comment: NEGATIVE
Comment: NORMAL
Neisseria Gonorrhea: NEGATIVE

## 2022-12-05 LAB — RPR: RPR Ser Ql: NONREACTIVE

## 2023-03-10 ENCOUNTER — Other Ambulatory Visit: Payer: Self-pay

## 2023-03-10 ENCOUNTER — Emergency Department (HOSPITAL_BASED_OUTPATIENT_CLINIC_OR_DEPARTMENT_OTHER): Payer: Medicaid Other

## 2023-03-10 ENCOUNTER — Encounter (HOSPITAL_BASED_OUTPATIENT_CLINIC_OR_DEPARTMENT_OTHER): Payer: Self-pay | Admitting: Urology

## 2023-03-10 ENCOUNTER — Emergency Department (HOSPITAL_BASED_OUTPATIENT_CLINIC_OR_DEPARTMENT_OTHER)
Admission: EM | Admit: 2023-03-10 | Discharge: 2023-03-10 | Disposition: A | Discharge status: Home / Self Care | Payer: Medicaid Other | Attending: Emergency Medicine | Admitting: Emergency Medicine

## 2023-03-10 DIAGNOSIS — E876 Hypokalemia: Secondary | ICD-10-CM | POA: Insufficient documentation

## 2023-03-10 DIAGNOSIS — R1084 Generalized abdominal pain: Secondary | ICD-10-CM | POA: Insufficient documentation

## 2023-03-10 DIAGNOSIS — R197 Diarrhea, unspecified: Secondary | ICD-10-CM | POA: Insufficient documentation

## 2023-03-10 DIAGNOSIS — E871 Hypo-osmolality and hyponatremia: Secondary | ICD-10-CM | POA: Insufficient documentation

## 2023-03-10 DIAGNOSIS — R112 Nausea with vomiting, unspecified: Secondary | ICD-10-CM | POA: Diagnosis not present

## 2023-03-10 DIAGNOSIS — R109 Unspecified abdominal pain: Secondary | ICD-10-CM | POA: Diagnosis present

## 2023-03-10 LAB — URINALYSIS, ROUTINE W REFLEX MICROSCOPIC
Bilirubin Urine: NEGATIVE
Glucose, UA: NEGATIVE mg/dL
Hgb urine dipstick: NEGATIVE
Ketones, ur: NEGATIVE mg/dL
Leukocytes,Ua: NEGATIVE
Nitrite: NEGATIVE
Protein, ur: 30 mg/dL — AB
Specific Gravity, Urine: 1.015 (ref 1.005–1.030)
pH: 8.5 — ABNORMAL HIGH (ref 5.0–8.0)

## 2023-03-10 LAB — COMPREHENSIVE METABOLIC PANEL
ALT: 21 U/L (ref 0–44)
AST: 23 U/L (ref 15–41)
Albumin: 3.3 g/dL — ABNORMAL LOW (ref 3.5–5.0)
Alkaline Phosphatase: 69 U/L (ref 38–126)
Anion gap: 9 (ref 5–15)
BUN: 9 mg/dL (ref 6–20)
CO2: 24 mmol/L (ref 22–32)
Calcium: 8 mg/dL — ABNORMAL LOW (ref 8.9–10.3)
Chloride: 100 mmol/L (ref 98–111)
Creatinine, Ser: 0.69 mg/dL (ref 0.44–1.00)
GFR, Estimated: >60 mL/min (ref >60)
Glucose, Bld: 99 mg/dL (ref 70–99)
Potassium: 3.3 mmol/L — ABNORMAL LOW (ref 3.5–5.1)
Sodium: 133 mmol/L — ABNORMAL LOW (ref 135–145)
Total Bilirubin: 0.8 mg/dL (ref 0.3–1.2)
Total Protein: 6.9 g/dL (ref 6.5–8.1)

## 2023-03-10 LAB — CBC
HCT: 38.8 % (ref 36.0–46.0)
Hemoglobin: 12.6 g/dL (ref 12.0–15.0)
MCH: 28.8 pg (ref 26.0–34.0)
MCHC: 32.5 g/dL (ref 30.0–36.0)
MCV: 88.6 fL (ref 80.0–100.0)
Platelets: 310 10*3/uL (ref 150–400)
RBC: 4.38 MIL/uL (ref 3.87–5.11)
RDW: 13.5 % (ref 11.5–15.5)
WBC: 7.5 10*3/uL (ref 4.0–10.5)
nRBC: 0 % (ref 0.0–0.2)

## 2023-03-10 LAB — URINALYSIS, MICROSCOPIC (REFLEX)

## 2023-03-10 LAB — LIPASE, BLOOD: Lipase: 28 U/L (ref 11–51)

## 2023-03-10 LAB — PREGNANCY, URINE: Preg Test, Ur: NEGATIVE

## 2023-03-10 MED ORDER — ONDANSETRON HCL 4 MG/2ML IJ SOLN
4.0000 mg | Freq: Once | INTRAMUSCULAR | Status: AC | PRN
  Administered 2023-03-10: 4 mg via INTRAVENOUS
  Filled 2023-03-10: qty 2

## 2023-03-10 MED ORDER — KETOROLAC TROMETHAMINE 30 MG/ML IJ SOLN
30.0000 mg | Freq: Once | INTRAMUSCULAR | Status: AC
  Administered 2023-03-10: 30 mg via INTRAVENOUS
  Filled 2023-03-10: qty 1

## 2023-03-10 MED ORDER — POTASSIUM CHLORIDE CRYS ER 20 MEQ PO TBCR
40.0000 meq | EXTENDED_RELEASE_TABLET | Freq: Once | ORAL | Status: AC
  Administered 2023-03-10: 40 meq via ORAL
  Filled 2023-03-10: qty 2

## 2023-03-10 MED ORDER — SODIUM CHLORIDE 0.9 % IV BOLUS
1000.0000 mL | Freq: Once | INTRAVENOUS | Status: AC
  Administered 2023-03-10: 1000 mL via INTRAVENOUS

## 2023-03-10 MED ORDER — IOHEXOL 300 MG/ML  SOLN
100.0000 mL | Freq: Once | INTRAMUSCULAR | Status: AC | PRN
  Administered 2023-03-10: 125 mL via INTRAVENOUS

## 2023-03-10 MED ORDER — ONDANSETRON 4 MG PO TBDP: 4.0000 mg | ORAL_TABLET | Freq: Three times a day (TID) | ORAL | 0 refills | Status: AC | PRN

## 2023-03-10 NOTE — ED Triage Notes (Signed)
Pt states lower abdominal pain, N/V since this morning at 0600 States fever this am of 102, normal at triage
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
## 2023-03-10 NOTE — Discharge Instructions (Addendum)
You have been seen today for your complaint of abdominal pain, nausea, vomiting, diarrhea. Your lab work  was reassuring. Your imaging was reassuring. Your discharge medications include zofran. This is a nausea medicine. Take it as needed in order to eat and drink a normal diet. Follow up with: your PCP within the next week Please seek immediate medical care if you develop any of the following symptoms: You cannot stop vomiting. Your pain is only in one part of the abdomen. Pain on the right side could be caused by appendicitis. You have bloody or black poop (stool), or poop that looks like tar. You have trouble breathing. You have chest pain. At this time there does not appear to be the presence of an emergent medical condition, however there is always the potential for conditions to change. Please read and follow the below instructions.  Do not take your medicine if  develop an itchy rash, swelling in your mouth or lips, or difficulty breathing; call 911 and seek immediate emergency medical attention if this occurs.  You may review your lab tests and imaging results in their entirety on your MyChart account.  Please discuss all results of fully with your primary care provider and other specialist at your follow-up visit.  Note: Portions of this text may have been transcribed using voice recognition software. Every effort was made to ensure accuracy; however, inadvertent computerized transcription errors may still be present.
